# Patient Record
Sex: Female | Born: 2005 | Race: White | Hispanic: No | Marital: Single | Smoking: Former smoker
Health system: Southern US, Community
[De-identification: ages and names within clinical notes are randomized; demographics above are authoritative.]

## PROBLEM LIST (undated history)

## (undated) DIAGNOSIS — F4310 Post-traumatic stress disorder, unspecified: Secondary | ICD-10-CM

## (undated) DIAGNOSIS — F32A Depression, unspecified: Secondary | ICD-10-CM

## (undated) DIAGNOSIS — F603 Borderline personality disorder: Secondary | ICD-10-CM

## (undated) DIAGNOSIS — R634 Abnormal weight loss: Secondary | ICD-10-CM

## (undated) DIAGNOSIS — Z789 Other specified health status: Secondary | ICD-10-CM

## (undated) HISTORY — PX: NO PAST SURGERIES: SHX2092

## (undated) HISTORY — DX: Abnormal weight loss: R63.4

## (undated) HISTORY — DX: Depression, unspecified: F32.A

---
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
## 2023-03-05 ENCOUNTER — Other Ambulatory Visit: Payer: Self-pay

## 2023-03-05 ENCOUNTER — Encounter (HOSPITAL_COMMUNITY): Payer: Self-pay

## 2023-03-05 ENCOUNTER — Emergency Department (HOSPITAL_COMMUNITY): Payer: Medicaid Other

## 2023-03-05 ENCOUNTER — Emergency Department (HOSPITAL_COMMUNITY)
Admission: EM | Admit: 2023-03-05 | Discharge: 2023-03-05 | Disposition: A | Discharge status: Home / Self Care | Payer: Medicaid Other | Attending: Emergency Medicine | Admitting: Emergency Medicine

## 2023-03-05 DIAGNOSIS — S80212A Abrasion, left knee, initial encounter: Secondary | ICD-10-CM | POA: Insufficient documentation

## 2023-03-05 DIAGNOSIS — W5522XA Struck by cow, initial encounter: Secondary | ICD-10-CM | POA: Insufficient documentation

## 2023-03-05 DIAGNOSIS — Z79899 Other long term (current) drug therapy: Secondary | ICD-10-CM | POA: Insufficient documentation

## 2023-03-05 DIAGNOSIS — S50312A Abrasion of left elbow, initial encounter: Secondary | ICD-10-CM | POA: Diagnosis not present

## 2023-03-05 DIAGNOSIS — S80211A Abrasion, right knee, initial encounter: Secondary | ICD-10-CM | POA: Diagnosis not present

## 2023-03-05 DIAGNOSIS — S8011XA Contusion of right lower leg, initial encounter: Secondary | ICD-10-CM | POA: Diagnosis not present

## 2023-03-05 DIAGNOSIS — S8991XA Unspecified injury of right lower leg, initial encounter: Secondary | ICD-10-CM | POA: Diagnosis present

## 2023-03-05 MED ORDER — ACETAMINOPHEN 500 MG PO TABS: 500.0000 mg | ORAL_TABLET | Freq: Four times a day (QID) | ORAL | 0 refills | Status: DC | PRN

## 2023-03-05 NOTE — ED Provider Notes (Signed)
[place] EMERGENCY DEPARTMENT AT [facility] Provider Note   CSN: [id number] Arrival date & time: 03/05/23  1036     History  No chief complaint on file.   Wendy Short is a 17 y.o. female.  The history is provided by the patient and medical records. No language interpreter was used.     17 year old female with significant history of PTSD, borderline personality disorder, depression, presenting for evaluation of leg injury.  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  This morning while working, her cow was startled, and accidentally pushed her to the ground.  She fell forward striking both knees against the ground as well as her left elbow.  The cow accidentally stepped on her right calf and she quickly pulled her legs away.  She reported she felt pain to her right knee.  Pain is described as a sharp achy sensation worse with movement and with ambulation.  She also endorsed tingling sensation to her right foot.  She denies hitting her head or loss of consciousness.  She has not had a tetanus as she is unvaccinated by choice.  She denies any specific treatment tried.  She did receive an ice pack and reports some improvement of her symptoms.  She is here at the urging of her mother who is at bedside.  Home Medications Prior to Admission medications   Medication Sig Start Date End Date Taking? Authorizing Provider  hydrocortisone (ANUSOL-HC) 25 MG suppository Place 1 suppository (25 mg total) rectally 2 (two) times daily. 06/11/21   Warner Mccreedy, MD  Prenatal Vit-Fe Fumarate-FA (PRENATAL MULTIVITAMIN) TABS tablet Take 1 tablet by mouth daily at 12 noon.    [provider]  sertraline (ZOLOFT) 100 MG tablet Take 100 mg by mouth daily. 05/13/21   [provider]  spironolactone (ALDACTONE) 100 MG tablet Take 100 mg by mouth daily. 05/12/21   [provider]      Allergies    Patient has no known allergies.    Review of Systems   Review of Systems  All other  systems reviewed and are negative.   Physical Exam Updated Vital Signs BP 103/62 (BP Location: Right Arm)   Pulse 75   Temp 98.4 F (36.9 C)   Resp 17   Ht 5\' 4"  (1.626 m)   Wt 45.4 kg   SpO2 100%   BMI 17.16 kg/m  Physical Exam Vitals and nursing note reviewed.  Constitutional:      General: She is not in acute distress.    Appearance: She is well-developed.  HENT:     Head: Atraumatic.  Eyes:     Conjunctiva/sclera: Conjunctivae normal.  Cardiovascular:     Rate and Rhythm: Normal rate and regular rhythm.     Pulses: Normal pulses.     Heart sounds: Normal heart sounds.  Pulmonary:     Effort: Pulmonary effort is normal.  Abdominal:     Palpations: Abdomen is soft.     Tenderness: There is no abdominal tenderness.  Musculoskeletal:        General: Tenderness (Abrasions noted to bilateral knees. R knee: Tenderness noted to right anterior knee.  Patella is located.  Pain with flexion and extension and however no joint laxity and no decreased range motion.) present.     Cervical back: Neck supple.     Comments: Abrasion noted to left elbow with normal elbow flexion extension and no deformity noted.  Skin:    Findings: No rash.  Neurological:  Mental Status: She is alert and oriented to person, place, and time.  Psychiatric:        Mood and Affect: Mood normal.     ED Results / Procedures / Treatments   Labs (all labs ordered are listed, but only abnormal results are displayed) Labs Reviewed - No data to display  EKG None  Radiology DG Knee Complete 4 Views Right  Result Date: 03/05/2023 CLINICAL DATA:  Leg pain.  No reported history of trauma EXAM: RIGHT KNEE - COMPLETE 5 VIEW; RIGHT TIBIA AND FIBULA - 2 VIEW COMPARISON:  None Available. FINDINGS: No evidence of fracture, dislocation, or joint effusion. No evidence of arthropathy or other focal bone abnormality. Soft tissues are unremarkable. IMPRESSION: No acute osseous abnormality. Electronically Signed    By: Karen Kays M.D.   On: 03/05/2023 11:57   DG Tibia/Fibula Right  Result Date: 03/05/2023 CLINICAL DATA:  Leg pain.  No reported history of trauma EXAM: RIGHT KNEE - COMPLETE 5 VIEW; RIGHT TIBIA AND FIBULA - 2 VIEW COMPARISON:  None Available. FINDINGS: No evidence of fracture, dislocation, or joint effusion. No evidence of arthropathy or other focal bone abnormality. Soft tissues are unremarkable. IMPRESSION: No acute osseous abnormality. Electronically Signed   By: Karen Kays M.D.   On: 03/05/2023 11:57    Procedures Procedures    Medications Ordered in ED   ED Course/ Medical Decision Making/ A&P                             Medical Decision Making Amount and/or Complexity of Data Reviewed Radiology: ordered.   BP 103/62 (BP Location: Right Arm)   Pulse 75   Temp 98.4 F (36.9 C)   Resp 17   Ht 5\' 4"  (1.626 m)   Wt 45.4 kg   SpO2 100%   BMI 17.16 kg/m  4:36 PM  4:36 PM Wendy Short is a 17 year old female who presents to the ED with chief complaint of leg pain x 1 day. 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 The animal pushed her forward to the ground and had partial weight on her right calf, but she was able to push the cow back and stand up to get away. The patient has not attempted to walk since the incident this morning. She has been using ice intermittently, but has not tried any OTC pain medications. The pain at the time of the incident was 6/10, but now has decreased to 3/10. Most of her pain is relegated to her right patella with some radiation down her tibia. She also endorses numbness in her toes on the right side.   On exam, patient is resting comfortably in the bed appears to be in no acute discomfort.  She has a small abrasion noted to her left elbow as well as abrasion to bilateral knees.  She does have tenderness to the right anterior knee on exam but no obvious deformity.  She is able to ambulate.  She did complain of some mild  tingling sensation to her right foot and toes.  However on exam she has normal sensation and she is able to perform full range of motion about the right foot.  No evidence concerning for compartment syndrome, no significant deep injury noted.   -Imaging independently viewed and interpreted by me and I agree with radiologist's interpretation.  Result remarkable for Xrays of the R knee and  R tib/fib without concerning changes -This patient presents to the ED for concern of leg injury, this involves an extensive number of treatment options, and is a complaint that carries with it a high risk of complications and morbidity.  The differential diagnosis includes strain, sprain, fx, dislocation, compartment syndrome, abrasions -Co morbidities that complicate the patient evaluation includes none -Treatment includes ace wrap -Reevaluation of the patient after these medicines showed that the patient improved -PCP office notes or outside notes reviewed -Escalation to admission/observation considered: patients feels much better, is comfortable with discharge, and will follow up with PCP -Prescription medication considered, patient comfortable with ibuprofen, flexeril -Social Determinant of Health considered  I offered patient crutches, and pain medication but she declined.  Also offered her to Tdap patient declined that as well.  I will provide patient work note per request.  Gave patient return precaution.          Final Clinical Impression(s) / ED Diagnoses Final diagnoses:  Contusion of right calf, initial encounter  Abrasion of both knees  Abrasion of left elbow, initial encounter    Rx / DC Orders ED Discharge Orders          Ordered    acetaminophen (TYLENOL) 500 MG tablet  Every 6 hours PRN        03/05/23 1640              Fayrene Helper, PA-C 03/05/23 1641    Pricilla Loveless, MD 03/10/23 [id number]

## 2023-03-05 NOTE — ED Notes (Signed)
Ace wrap applied. PMS intact prior to and post wrap

## 2023-03-05 NOTE — ED Triage Notes (Signed)
Pt trampled by cow this am; c/o R knee and lower leg pain; unable to bare weigh and unable to bed leg; states leg bent inward; pedal pulse and sensation intact, limited movement; no obvious deformity

## 2023-03-05 NOTE — Discharge Instructions (Signed)
You have been evaluated for your injury.  Fortunately x-ray of your right knee and your right lower leg did not show any broken bone or dislocation.  Your pain is likely due to muscle bruising or possible ligament sprain.  Please use Ace wrap for support.  Take Tylenol as needed for pain, you may follow-up with orthopedic doctor as needed for further care.

## 2023-03-05 NOTE — ED Notes (Signed)
Pt is a&ox4, pwd. Pt states that she was trampled by a cow, sustaining an injury to right knee. She has pain 1/10, slight swelling and bruising to knee cap. PMS intact. Pt denies any other pain or discomfort. No other injuries noted or reported. Mother is at the bedside. Pt near RN station if she needs anything.

## 2024-04-13 ENCOUNTER — Telehealth: Admitting: *Deleted

## 2024-04-13 VITALS — Ht 65.0 in

## 2024-04-13 DIAGNOSIS — O0991 Supervision of high risk pregnancy, unspecified, first trimester: Secondary | ICD-10-CM

## 2024-04-13 DIAGNOSIS — O0990 Supervision of high risk pregnancy, unspecified, unspecified trimester: Secondary | ICD-10-CM

## 2024-04-13 DIAGNOSIS — Z3A10 10 weeks gestation of pregnancy: Secondary | ICD-10-CM

## 2024-04-13 DIAGNOSIS — Z3490 Encounter for supervision of normal pregnancy, unspecified, unspecified trimester: Secondary | ICD-10-CM | POA: Insufficient documentation

## 2024-04-13 NOTE — Progress Notes (Addendum)
 New OB Intake  I connected with Wendy Short  on 04/13/24 at  2:15 PM EDT by MyChart Video Visit and verified that I am speaking with the correct person using two identifiers. Nurse is located at [facility] and pt is located at home.  I discussed the limitations, risks, security and privacy concerns of performing an evaluation and management service by telephone and the availability of in person appointments. I also discussed with the patient that there may be a patient responsible charge related to this service. The patient expressed understanding and agreed to proceed.  I explained I am completing New OB Intake today. We discussed EDD of 11/03/04 based on LMP of 01/28/24. I offered dating US  because she explained her period was  a little different but she declined to have dating US . Pt is G3P011.  I reviewed her allergies, medications and Medical/Surgical/OB history.    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 Discussed the nature of our practice with multiple providers including residents and students. Due to the size of the practice, the delivering provider may not be the same as those providing prenatal care.   Patient is interested in water birth.  MyChart/Babyscripts MyChart access verified. I explained pt will have some visits in office and some virtually. Babyscripts instructions given and order placed.   Blood Pressure Cuff/Weight Scale She has a blood pressure cuff.  Explained after first prenatal appt pt will check weekly and document in Babyscripts. Patient does have weight scale.  Anatomy US  Explained first scheduled US  will be around 19 weeks.  Anatomy US  will be scheduled and patient notified by MyChart.   Is patient a CenteringPregnancy candidate?  Declined Declined due to Childcare   Is patient a Mom+Baby Combined Care candidate?  Not a candidate    Is patient a candidate for Babyscripts Optimization? Yes, patient accepted    First visit review I reviewed new OB appt with patient. Explained pt will be seen by Cornell Finder, CNM at first visit. Discussed [place] genetic screening with patient. She would like  Panorama drawn with routine prenatal labs at new ob visit.    Last Pap No results found for: EDMON Rock Skip OBIE 04/13/2024  3:06 PM

## 2024-04-18 ENCOUNTER — Encounter: Payer: Self-pay | Admitting: Certified Nurse Midwife

## 2024-04-18 ENCOUNTER — Other Ambulatory Visit: Payer: Self-pay | Admitting: *Deleted

## 2024-04-18 DIAGNOSIS — O0990 Supervision of high risk pregnancy, unspecified, unspecified trimester: Secondary | ICD-10-CM

## 2024-04-18 MED ORDER — ONDANSETRON 4 MG PO TBDP: 4.0000 mg | ORAL_TABLET | Freq: Three times a day (TID) | ORAL | 0 refills | Status: DC | PRN

## 2024-04-29 ENCOUNTER — Other Ambulatory Visit: Payer: Self-pay | Admitting: Lactation Services

## 2024-04-29 MED ORDER — ONDANSETRON HCL 4 MG PO TABS: 4.0000 mg | ORAL_TABLET | Freq: Three times a day (TID) | ORAL | 1 refills | Status: DC | PRN

## 2024-05-04 ENCOUNTER — Ambulatory Visit (INDEPENDENT_AMBULATORY_CARE_PROVIDER_SITE_OTHER): Payer: Self-pay | Admitting: Certified Nurse Midwife

## 2024-05-04 ENCOUNTER — Other Ambulatory Visit (HOSPITAL_COMMUNITY)
Admission: RE | Admit: 2024-05-04 | Discharge: 2024-05-04 | Disposition: A | Discharge status: Home / Self Care | Source: Ambulatory Visit | Attending: Certified Nurse Midwife | Admitting: Certified Nurse Midwife

## 2024-05-04 ENCOUNTER — Other Ambulatory Visit: Payer: Self-pay

## 2024-05-04 VITALS — BP 111/72 | HR 80 | Wt 112.3 lb

## 2024-05-04 DIAGNOSIS — Z3490 Encounter for supervision of normal pregnancy, unspecified, unspecified trimester: Secondary | ICD-10-CM | POA: Diagnosis present

## 2024-05-04 DIAGNOSIS — O99611 Diseases of the digestive system complicating pregnancy, first trimester: Secondary | ICD-10-CM | POA: Diagnosis not present

## 2024-05-04 DIAGNOSIS — O0931 Supervision of pregnancy with insufficient antenatal care, first trimester: Secondary | ICD-10-CM | POA: Diagnosis not present

## 2024-05-04 DIAGNOSIS — Z1332 Encounter for screening for maternal depression: Secondary | ICD-10-CM

## 2024-05-04 DIAGNOSIS — Z3A13 13 weeks gestation of pregnancy: Secondary | ICD-10-CM | POA: Diagnosis not present

## 2024-05-04 DIAGNOSIS — O0932 Supervision of pregnancy with insufficient antenatal care, second trimester: Secondary | ICD-10-CM

## 2024-05-04 DIAGNOSIS — K5900 Constipation, unspecified: Secondary | ICD-10-CM

## 2024-05-04 DIAGNOSIS — O219 Vomiting of pregnancy, unspecified: Secondary | ICD-10-CM

## 2024-05-04 MED ORDER — POLYETHYLENE GLYCOL 3350 17 G PO PACK
17.0000 g | PACK | Freq: Every day | ORAL | 1 refills | Status: AC
Start: 2024-05-04 — End: ?

## 2024-05-04 MED ORDER — ONDANSETRON 4 MG PO TBDP: 4.0000 mg | ORAL_TABLET | Freq: Three times a day (TID) | ORAL | 0 refills | Status: DC | PRN

## 2024-05-05 LAB — CBC/D/PLT+RPR+RH+ABO+RUBIGG...
Antibody Screen: NEGATIVE
Basophils Absolute: 0 x10E3/uL (ref 0.0–0.2)
Basos: 0 %
EOS (ABSOLUTE): 0.1 x10E3/uL (ref 0.0–0.4)
Eos: 1 %
HCV Ab: NONREACTIVE
HIV Screen 4th Generation wRfx: NONREACTIVE
Hematocrit: 37.3 % (ref 34.0–46.6)
Hemoglobin: 12.5 g/dL (ref 11.1–15.9)
Hepatitis B Surface Ag: NEGATIVE
Immature Grans (Abs): 0 x10E3/uL (ref 0.0–0.1)
Immature Granulocytes: 0 %
Lymphocytes Absolute: 2.1 x10E3/uL (ref 0.7–3.1)
Lymphs: 23 %
MCH: 31.8 pg (ref 26.6–33.0)
MCHC: 33.5 g/dL (ref 31.5–35.7)
MCV: 95 fL (ref 79–97)
Monocytes Absolute: 0.6 x10E3/uL (ref 0.1–0.9)
Monocytes: 6 %
Neutrophils Absolute: 6.3 x10E3/uL (ref 1.4–7.0)
Neutrophils: 70 %
Platelets: 223 x10E3/uL (ref 150–450)
RBC: 3.93 x10E6/uL (ref 3.77–5.28)
RDW: 12.1 % (ref 11.7–15.4)
RPR Ser Ql: NONREACTIVE
Rh Factor: POSITIVE
Rubella Antibodies, IGG: <0.9 {index} — ABNORMAL LOW (ref >0.99)
WBC: 9.2 x10E3/uL (ref 3.4–10.8)

## 2024-05-05 LAB — CERVICOVAGINAL ANCILLARY ONLY
Chlamydia: NEGATIVE
Comment: NEGATIVE
Comment: NORMAL
Neisseria Gonorrhea: NEGATIVE

## 2024-05-05 LAB — ANEMIA PROFILE B
Ferritin: 24 ng/mL (ref 15–77)
Folate: >20 ng/mL (ref >3.0)
Iron Saturation: 22 % (ref 15–55)
Iron: 97 ug/dL (ref 27–159)
Retic Ct Pct: 1.9 % (ref 0.6–2.6)
Total Iron Binding Capacity: 433 ug/dL (ref 250–450)
UIBC: 336 ug/dL (ref 131–425)
Vitamin B-12: 312 pg/mL (ref 232–1245)

## 2024-05-05 LAB — HEMOGLOBIN A1C
Est. average glucose Bld gHb Est-mCnc: 94 mg/dL
Hgb A1c MFr Bld: 4.9 % (ref 4.8–5.6)

## 2024-05-05 LAB — HCV INTERPRETATION

## 2024-05-05 LAB — VITAMIN D 25 HYDROXY (VIT D DEFICIENCY, FRACTURES): Vit D, 25-Hydroxy: 33 ng/mL (ref 30.0–100.0)

## 2024-05-06 LAB — CULTURE, OB URINE

## 2024-05-06 LAB — URINE CULTURE, OB REFLEX

## 2024-05-09 LAB — PANORAMA PRENATAL TEST FULL PANEL:PANORAMA TEST PLUS 5 ADDITIONAL MICRODELETIONS: FETAL FRACTION: 7.4

## 2024-05-10 NOTE — Progress Notes (Signed)
 History:   Wendy Short is a 18 y.o. G3P1011 at [redacted]w[redacted]d by LMP being seen today for her first obstetrical visit.  Her obstetrical history is significant for one uncomplicated pregnancy with SVD. Patient does intend to breast feed. Pregnancy history fully reviewed.  Patient reports continued nausea that responds well to zofran  but not diclegis.   HISTORY: OB History  Gravida Para Term Preterm AB Living  3 1 1  0 1 1  SAB IAB Ectopic Multiple Live Births  0 0 0 0 1    # Outcome Date GA Lbr Len/2nd Weight Sex Type Anes PTL Lv  3 Current           2 AB 2023          1 Term 12/28/20 [redacted]w[redacted]d 07:17 / 00:55 6 lb 10.2 oz (3.01 kg) F Vag-Spont None  LIV     Birth Comments: WNL poor fetal growth  on US      Name: CAROLIN, QUANG     Apgar1: 8  Apgar5: 9    Pap not indicated (age)  Past Medical History:  Diagnosis Date   Borderline personality disorder (HCC)    Depression    Poor fetal growth affecting management of mother in third trimester 11/19/2020   Pregnancy affected by fetal growth restriction 12/24/2020   PTSD (post-traumatic stress disorder)    Supervision of high risk pregnancy, antepartum 05/31/2020    Nursing Staff Provider Office Location [facility] Dating  Early US  at [redacted]w[redacted]d Language  English Anatomy US    Flu Vaccine  Declined Genetic Screen  NIPS: low risk female AFP:  Screen Negative  TDaP Vaccine  Declined 11/19/20  Hgb A1C or  GTT Early  A1C 5.3 Third trimester  COVID Vaccine no   LAB RESULTS  Rhogam  NA O+ Blood Type O/Positive/-- (09/21 1608)  Feeding Plan Breast Antibody Negative (09/21 1608) C   Supervision of low-risk first pregnancy 12/28/2020   Unplanned pregnancy 06/19/2020   Weight loss    Past Surgical History:  Procedure Laterality Date   NO PAST SURGERIES     Family History  Problem Relation Age of Onset   Skin cancer Mother    Allergies Father    Asthma Sister    Colitis Paternal Grandfather    Social History   Tobacco Use   Smoking status: Former     Current packs/day: 0.00    Average packs/day: 0.3 packs/day for 0.5 years (0.1 ttl pk-yrs)    Types: E-cigarettes, Cigarettes    Start date: 02/29/2020    Quit date: 08/29/2020    Years since quitting: 3.6   Smokeless tobacco: Never  Vaping Use   Vaping status: Former  Substance Use Topics   Alcohol use: Not Currently   Drug use: Not Currently    Types: Marijuana   No Known Allergies Current Outpatient Medications on File Prior to Visit  Medication Sig Dispense Refill   Prenatal MV & Min w/FA-DHA (PRENATAL GUMMIES PO) Take 2 tablets by mouth daily at 6 (six) AM.     No current facility-administered medications on file prior to visit.   Review of Systems Pertinent items noted in HPI and remainder of comprehensive ROS otherwise negative.  Physical Exam:   Vitals:   05/04/24 1032  BP: 111/72  Pulse: 80  Weight: 112 lb 4.8 oz (50.9 kg)   Fetal Heart Rate (bpm): 160  Constitutional: Well-developed, well-nourished pregnant female in no acute distress.  HEENT: PERRLA Skin: normal color and turgor, no rash Cardiovascular:  normal rate & rhythm, warm and well perfused Respiratory: normal effort, no problems with respiration noted GI: Abd soft, non-distended MS: Extremities nontender, no edema, normal ROM Neurologic: Alert and oriented x 4.  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 Encounter for supervision of low-risk pregnancy, antepartum (Primary) - Doing well, has been able to gain a little weight despite the nausea - CBC/D/Plt+RPR+Rh+ABO+RubIgG... - HgB A1c - Vitamin D  (25 hydroxy) - Anemia Profile B - Culture, OB Urine -  PANORAMA PRENATAL TEST - Cervicovaginal ancillary only  2. [redacted] weeks gestation of pregnancy - Routine OB care, will offer AFP at next visit  3. Nausea and vomiting during pregnancy - ondansetron  (ZOFRAN -ODT) 4 MG disintegrating tablet; Take 1 tablet (4 mg total) by mouth every 8 (eight) hours as needed for nausea or vomiting.  Dispense: 20 tablet; Refill: 0  4. Constipation during pregnancy in first trimester - polyethylene glycol (MIRALAX ) 17 g packet; Take 17 g by mouth daily.  Dispense: 30 each; Refill: 1  5. Initial obstetric visit in second trimester - Initial labs drawn. - Continue prenatal vitamins. - Problem list reviewed and updated. - Genetic Screening discussed, First trimester screen, Quad screen, and NIPS: ordered. - Ultrasound discussed; fetal anatomic survey: ordered. - Anticipatory guidance about prenatal visits given including labs, ultrasounds, and testing. - Discussed usage of Babyscripts and virtual visits as additional source of managing and completing prenatal visits in midst of coronavirus and pandemic.   - Encouraged to complete MyChart Registration for her ability to review results, send requests, and have questions addressed.  - The nature of [facility] Healthcare/Faculty Practice with multiple MDs and Advanced Practice Providers was explained to patient; also emphasized that residents, students are part of our team. - Routine obstetric precautions reviewed. Encouraged to seek out care at office or emergency room [facility] preferred) for urgent and/or emergent concerns.  Return in about 4 weeks (around 06/01/2024) for IN-PERSON, LOB.    Future Appointments  Date Time Provider Department Center  06/08/2024 11:15 AM Vannie Cornell JONELLE EDDY [facility]  06/14/2024  8:00 AM [facility] PROVIDER 1 [facility]  06/14/2024  8:30 AM [facility] US3 [facility]    Cornell Vannie, MSN, CNM, IBCLC Certified Nurse Midwife, [facility]

## 2024-05-12 MED ORDER — ONDANSETRON 4 MG PO TBDP: 4.0000 mg | ORAL_TABLET | Freq: Three times a day (TID) | ORAL | 0 refills | Status: DC | PRN

## 2024-06-08 ENCOUNTER — Other Ambulatory Visit: Payer: Self-pay

## 2024-06-08 ENCOUNTER — Ambulatory Visit: Payer: Self-pay | Admitting: Certified Nurse Midwife

## 2024-06-08 VITALS — BP 102/58 | HR 92 | Wt 116.3 lb

## 2024-06-08 DIAGNOSIS — Z3A18 18 weeks gestation of pregnancy: Secondary | ICD-10-CM | POA: Diagnosis not present

## 2024-06-08 DIAGNOSIS — Z3492 Encounter for supervision of normal pregnancy, unspecified, second trimester: Secondary | ICD-10-CM | POA: Diagnosis not present

## 2024-06-08 NOTE — Progress Notes (Signed)
   PRENATAL VISIT NOTE  Subjective:  Wendy Short is a 18 y.o. G3P1011 at [redacted]w[redacted]d being seen today for ongoing prenatal care.  She is currently monitored for the following issues for this low-risk pregnancy and has MDD (major depressive disorder), recurrent severe, without psychosis (HCC); Chronic post-traumatic stress disorder (PTSD); Rubella non-immune status, antepartum; and Supervision of low-risk pregnancy on their problem list.  Patient reports no complaints.  Contractions: Not present. Vag. Bleeding: None.  Movement: Present. Denies leaking of fluid.   The following portions of the patient's history were reviewed and updated as appropriate: allergies, current medications, past family history, past medical history, past social history, past surgical history and problem list.   Objective:    Vitals:   06/08/24 1134  BP: (!) 102/58  Pulse: 92  Weight: 116 lb 4.8 oz (52.8 kg)    Fetal Status:  Fetal Heart Rate (bpm): 155   Movement: Present    General: Alert, oriented and cooperative. Patient is in no acute distress.  Skin: Skin is warm and dry. No rash noted.   Cardiovascular: Normal heart rate noted  Respiratory: Normal respiratory effort, no problems with respiration noted  Abdomen: Soft, gravid, appropriate for gestational age.  Pain/Pressure: Absent     Pelvic: Cervical exam deferred        Extremities: Normal range of motion.  Edema: None  Mental Status: Normal mood and affect. Normal behavior. Normal judgment and thought content.   Assessment and Plan:  Pregnancy: G3P1011 at [redacted]w[redacted]d 1. Encounter for supervision of low-risk pregnancy in second trimester (Primary) - Starting to feel some fetal movement  2. [redacted] weeks gestation of pregnancy - Routine OB care  - AFP, Serum, Open Spina Bifida  Preterm labor symptoms and general obstetric precautions including but not limited to vaginal bleeding, contractions, leaking of fluid and fetal movement were reviewed in detail with  the patient. Please refer to After Visit Summary for other counseling recommendations.   Return in about 4 weeks (around 07/06/2024) for IN-PERSON, LOB.  Future Appointments  Date Time Provider Department Center  06/14/2024  8:00 AM [facility] PROVIDER 1 [facility]  06/14/2024  8:30 AM [facility] US3 [facility]  07/06/2024  3:15 PM Vannie Cornell JONELLE EDDY [facility]  08/10/2024 10:55 AM Vannie Cornell JONELLE, CNM [facility]   Cornell JONELLE Vannie, CNM

## 2024-06-10 LAB — AFP, SERUM, OPEN SPINA BIFIDA
AFP MoM: 0.89
AFP Value: 49.6 ng/mL
Gest. Age on Collection Date: 18.6 wk
Maternal Age At EDD: 20.1 a
OSBR Risk 1 IN: 10000
Test Results:: NEGATIVE
Weight: 116 [lb_av]

## 2024-06-13 ENCOUNTER — Other Ambulatory Visit: Payer: Self-pay | Admitting: *Deleted

## 2024-06-13 ENCOUNTER — Encounter: Payer: Self-pay | Admitting: Certified Nurse Midwife

## 2024-06-13 MED ORDER — ONDANSETRON 4 MG PO TBDP: 4.0000 mg | ORAL_TABLET | Freq: Three times a day (TID) | ORAL | 0 refills | Status: DC | PRN

## 2024-06-14 ENCOUNTER — Ambulatory Visit: Attending: Maternal & Fetal Medicine | Admitting: Maternal & Fetal Medicine

## 2024-06-14 ENCOUNTER — Other Ambulatory Visit: Payer: Self-pay | Admitting: Certified Nurse Midwife

## 2024-06-14 ENCOUNTER — Ambulatory Visit (HOSPITAL_BASED_OUTPATIENT_CLINIC_OR_DEPARTMENT_OTHER)

## 2024-06-14 VITALS — BP 116/51

## 2024-06-14 DIAGNOSIS — Z363 Encounter for antenatal screening for malformations: Secondary | ICD-10-CM | POA: Diagnosis present

## 2024-06-14 DIAGNOSIS — O3503X0 Maternal care for (suspected) central nervous system malformation or damage in fetus, choroid plexus cysts, not applicable or unspecified: Secondary | ICD-10-CM

## 2024-06-14 DIAGNOSIS — Z3490 Encounter for supervision of normal pregnancy, unspecified, unspecified trimester: Secondary | ICD-10-CM

## 2024-06-14 DIAGNOSIS — Z3A19 19 weeks gestation of pregnancy: Secondary | ICD-10-CM | POA: Diagnosis not present

## 2024-06-14 DIAGNOSIS — O358XX0 Maternal care for other (suspected) fetal abnormality and damage, not applicable or unspecified: Secondary | ICD-10-CM | POA: Diagnosis not present

## 2024-06-14 DIAGNOSIS — O0990 Supervision of high risk pregnancy, unspecified, unspecified trimester: Secondary | ICD-10-CM

## 2024-06-14 DIAGNOSIS — O3502X0 Maternal care for (suspected) central nervous system malformation or damage in fetus, anencephaly, not applicable or unspecified: Secondary | ICD-10-CM | POA: Diagnosis not present

## 2024-06-14 NOTE — Progress Notes (Signed)
 Patient information  Patient Name: Wendy Short  Patient MRN:   [id number]  Referring practice: MFM Referring Provider: [facility])  Problem List   Patient Active Problem List   Diagnosis Date Noted   Supervision of low-risk pregnancy 04/13/2024   Rubella non-immune status, antepartum 06/21/2020   Chronic post-traumatic stress disorder (PTSD) 06/25/2018   MDD (major depressive disorder), recurrent severe, without psychosis (HCC) 06/24/2018   Maternal Fetal Medicine Consult Wendy Short is a 18 y.o. G3P1011 at [redacted]w[redacted]d here for ultrasound and consultation. She had Low risk aneuploidy screening of a female fetus. Carrier screening was Negative for the basic screening (SMA, alpha-thal, beta-thal, and cystic fibroisis. Maternal serum AFP was negative. She has no acute concerns.   Today we focused on the following:   Choroid plexus cyst (CPC) I discussed the finding of right sided choroid plexus cyst was noted in the lateral ventricle of the fetal brain. There are no other structural abnormalities and management low risk aneuploidy screening.  In this context, a CPC is considered a normal variant and no further workup is needed at this time.  The Society for maternal-fetal medicine does not recommend amniocentesis for isolated choroid plexus cysts in the setting of low risk aneuploidy screening.  I reassured the patient that this should be interpreted as a normal variant and does not have any adverse impact on her fetus.  CPCs are fluid-filled structures in the choroid plexus. CPCs may be single or multiple, unilateral or bilateral, and most often are <1 cm in diameter. CPCs are identified in approximately 1% to 2% of fetuses in the second trimester of pregnancy and are commonly isolated findings in fetuses with euploid. CPCs are associated with trisomy 23, as they are present in 30% to 50% of fetuses with this disorder.62 When a fetus is affected by trisomy 18,  multiple structural anomalies are almost always evident, including structural heart defects, clenched hands, talipes deformity of the feet, FGR, and polyhydramnios. When a structural anomaly is present in addition to CPCs, the positive LR is 66. In the absence of other ultrasonographic abnormalities, the likelihood of trisomy 65 with isolated CPCs is much lower. Early studies suggested that isolated CPCs conferred a 1 in 200 to 1 in 400 risk of trisomy 1. More recent studies among women with isolated CPCs suggest a range of positive LRs for trisomy 18, from 0.9 to 5.6, with most studies suggesting low risk. Based on the literature, the best estimate for the LR is <2, suggesting a minimal risk. The presence of a CPC does not alter the risk of trisomy 27. Ultrasound characteristics of CPCs (size, complexity, laterality, and persistence) should not be used to modify risk further because these factors do not impact the likelihood of trisomy 18.  A CPC is not considered a structural or functional brain abnormality, and nearly all CPCs resolve by 28 weeks. Neurodevelopmental outcomes in children with euploidy born after a prenatal diagnosis of CPCs have not shown differences in neurocognitive ability, motor function, or behavior.  Sonographic findings Single intrauterine pregnancy at 19w 5d. Fetal cardiac activity:  Observed and appears normal. Presentation: Cephalic. The anatomic structures that were well seen appear normal without evidence of soft markers. The anatomic survey is complete.  Fetal biometry shows the estimated fetal weight at the 77 percentile. Amniotic fluid: Within normal limits.  MVP: 3.07 cm. Placenta: Posterior. Adnexa: No abnormality visualized. Cervical length: 4.1 cm.  There are limitations of prenatal ultrasound such  as the inability to detect certain abnormalities due to poor visualization. Various factors such as fetal position, gestational age and maternal body habitus may  increase the difficulty in visualizing the fetal anatomy.    Recommendations -EDD should be 11/03/2024 based on  LMP  (01/28/24). -No further ultrasounds are recommended at this time based on the current indications. If future indications arise (e.g. size/date discrepancy on fundal height, gestational diabetes or hypertension) and an ultrasound is to be desired at our MFM office, please send a referral.   Review of Systems: A review of systems was performed and was negative except per HPI   Past Obstetrical History:  OB History  Gravida Para Term Preterm AB Living  3 1 1  1 1   SAB IAB Ectopic Multiple Live Births     0 1    # Outcome Date GA Lbr Len/2nd Weight Sex Type Anes PTL Lv  3 Current           2 AB 2023          1 Term 12/28/20 [redacted]w[redacted]d 07:17 / 00:55 6 lb 10.2 oz (3.01 kg) F Vag-Spont None  LIV     Birth Comments: WNL poor fetal growth  on US      Past Medical History:  Past Medical History:  Diagnosis Date   Borderline personality disorder (HCC)    Depression    Poor fetal growth affecting management of mother in third trimester 11/19/2020   Pregnancy affected by fetal growth restriction 12/24/2020   PTSD (post-traumatic stress disorder)    Supervision of high risk pregnancy, antepartum 05/31/2020    Nursing Staff Provider Office Location [facility] Dating  Early US  at [redacted]w[redacted]d Language  English Anatomy US    Flu Vaccine  Declined Genetic Screen  NIPS: low risk female AFP:  Screen Negative  TDaP Vaccine  Declined 11/19/20  Hgb A1C or  GTT Early  A1C 5.3 Third trimester  COVID Vaccine no   LAB RESULTS  Rhogam  NA O+ Blood Type O/Positive/-- (09/21 1608)  Feeding Plan Breast Antibody Negative (09/21 1608) C   Supervision of low-risk first pregnancy 12/28/2020   Unplanned pregnancy 06/19/2020   Weight loss      Past Surgical History:    Past Surgical History:  Procedure Laterality Date   NO PAST SURGERIES       Home Medications:   Current Outpatient Medications on File Prior to  Visit  Medication Sig Dispense Refill   ondansetron  (ZOFRAN -ODT) 4 MG disintegrating tablet Take 1 tablet (4 mg total) by mouth every 8 (eight) hours as needed for nausea or vomiting. 30 tablet 0   polyethylene glycol (MIRALAX ) 17 g packet Take 17 g by mouth daily. 30 each 1   Prenatal MV & Min w/FA-DHA (PRENATAL GUMMIES PO) Take 2 tablets by mouth daily at 6 (six) AM.     No current facility-administered medications on file prior to visit.    Allergies:   No Known Allergies   Physical Exam:   Vitals:   06/14/24 0812  BP: (!) 116/51   Sitting comfortably on the sonogram table Nonlabored breathing Normal rate and rhythm Abdomen is nontender  Thank you for the opportunity to be involved with this patient's care. Please let us  know if we can be of any further assistance.   30 minutes of time was spent reviewing the patient's chart including labs, imaging and documentation.  At least 50% of this time was spent with direct patient care discussing the diagnosis, management  and prognosis of her care.  Delora Smaller MFM, [facility]   06/14/2024  2:34 PM

## 2024-06-27 ENCOUNTER — Encounter: Payer: Self-pay | Admitting: Certified Nurse Midwife

## 2024-07-06 ENCOUNTER — Encounter: Payer: Self-pay | Admitting: Certified Nurse Midwife

## 2024-07-06 ENCOUNTER — Other Ambulatory Visit: Payer: Self-pay

## 2024-07-06 ENCOUNTER — Ambulatory Visit (INDEPENDENT_AMBULATORY_CARE_PROVIDER_SITE_OTHER): Admitting: Certified Nurse Midwife

## 2024-07-06 VITALS — BP 109/66 | HR 93 | Wt 120.1 lb

## 2024-07-06 DIAGNOSIS — H539 Unspecified visual disturbance: Secondary | ICD-10-CM

## 2024-07-06 DIAGNOSIS — Z3492 Encounter for supervision of normal pregnancy, unspecified, second trimester: Secondary | ICD-10-CM | POA: Diagnosis not present

## 2024-07-06 DIAGNOSIS — Z3A22 22 weeks gestation of pregnancy: Secondary | ICD-10-CM

## 2024-07-06 NOTE — Progress Notes (Unsigned)
   PRENATAL VISIT NOTE  Subjective:  Wendy Short is a 18 y.o. G3P1011 at [redacted]w[redacted]d being seen today for ongoing prenatal care.  She is currently monitored for the following issues for this low-risk pregnancy and has MDD (major depressive disorder), recurrent severe, without psychosis (HCC); Chronic post-traumatic stress disorder (PTSD); Rubella non-immune status, antepartum; Supervision of low-risk pregnancy; and Choroid plexus cyst of fetus affecting care of mother, antepartum on their problem list.  Patient reports doing well, but does note occasional spells where the sight in her right eye will go out and her pupil will dilate for about 30 minutes. No pain, dizziness or associated symptoms. Started happening in her last pregnancy and have continued - note they are more frequent during pregnancy (every 1-49mo vs q82mo non-pregnant).  Contractions: Irritability. Vag. Bleeding: None.  Movement: Present. Denies leaking of fluid.   The following portions of the patient's history were reviewed and updated as appropriate: allergies, current medications, past family history, past medical history, past social history, past surgical history and problem list.   Objective:   Vitals:   07/06/24 1553  BP: 109/66  Pulse: 93  Weight: 120 lb 1.6 oz (54.5 kg)   Fetal Status:  Fetal Heart Rate (bpm): 157   Movement: Present    General: Alert, oriented and cooperative. Patient is in no acute distress.  Skin: Skin is warm and dry. No rash noted.   Cardiovascular: Normal heart rate noted  Respiratory: Normal respiratory effort, no problems with respiration noted  Abdomen: Soft, gravid, appropriate for gestational age.  Pain/Pressure: Absent     Pelvic: Cervical exam deferred        Extremities: Normal range of motion.  Edema: None  Mental Status: Normal mood and affect. Normal behavior. Normal judgment and thought content.   Assessment and Plan:  Pregnancy: G3P1011 at [redacted]w[redacted]d 1. Encounter for supervision of  low-risk pregnancy in second trimester (Primary) - Doing well, feeling regular and vigorous fetal movement   2. [redacted] weeks gestation of pregnancy - Routine OB care including anticipatory guidance for GTT at next visit (will premedicate with zofran )  3. Visual disturbance of one eye - Referred to local optometrist who will refer to opthalmology if needed - Ambulatory referral to Neurology  Preterm labor symptoms and general obstetric precautions including but not limited to vaginal bleeding, contractions, leaking of fluid and fetal movement were reviewed in detail with the patient. Please refer to After Visit Summary for other counseling recommendations.   Return in about 4 weeks (around 08/03/2024) for IN-PERSON, LOB/GTT.  Future Appointments  Date Time Provider Department Center  08/10/2024  9:20 AM [facility]  08/10/2024 10:55 AM Vannie Cornell SAUNDERS, CNM [facility]   Cornell SAUNDERS Vannie, CNM

## 2024-07-12 ENCOUNTER — Ambulatory Visit (INDEPENDENT_AMBULATORY_CARE_PROVIDER_SITE_OTHER): Admitting: Neurology

## 2024-07-12 ENCOUNTER — Encounter: Payer: Self-pay | Admitting: Neurology

## 2024-07-12 VITALS — BP 100/60 | HR 89 | Ht 65.5 in | Wt 123.5 lb

## 2024-07-12 DIAGNOSIS — H539 Unspecified visual disturbance: Secondary | ICD-10-CM | POA: Diagnosis not present

## 2024-07-12 DIAGNOSIS — G43109 Migraine with aura, not intractable, without status migrainosus: Secondary | ICD-10-CM

## 2024-07-12 DIAGNOSIS — Z3490 Encounter for supervision of normal pregnancy, unspecified, unspecified trimester: Secondary | ICD-10-CM

## 2024-07-12 NOTE — Progress Notes (Signed)
 [facility]  PATIENT: Wendy Short DOB: [birth year]  REFERRING DOCTOR OR PCP: Cornell Finder, CNM SOURCE: Patient, notes from OB/GYN  _________________________________   HISTORICAL  CHIEF COMPLAINT:  Chief Complaint  Patient presents with   New Patient (Initial Visit)    Pt in room 10. Mother in room. Here for Urgent internal referral for visual disturbance of one eye, more frequent during pregnancy.    HISTORY OF PRESENT ILLNESS:  I had the pleasure to see your patient, Wendy Short, at [facility] for neurologic consultation regarding her intermittent visual disturbances that have become more frequent during her pregnancy.  She is a 18 year old woman who is currently 6 months pregnant who has noted visual changes since shortly after her first daughter was born in 2022.   These events are fairly similar.  She notes reduced visual field out of the right eye, in the right (temporal) field.  Sometimes she sees static (some white spots) in the hemifield.  She notes OS vision will be fine.  These episodes lasting 90 minutes.    She also has been noted to have pupil dilation on the right during these episodes.   Sometimes she has 60-90 minutes pupil dilation without VF changes.   In between episodes vision is fine.    The more severe episodes have occurred about 7-8 times, with 3 episodes since pregnant.    She does not note any precipitating event or trigger.   She will sometimes have a right sided headache but these do not follow the visual episodes.   These visual symptoms are not followed by headache.  She is currently 6 months pregnant and has not had any problems.     She has not had any recent imaging.   REVIEW OF SYSTEMS: Constitutional: No fevers, chills, sweats, or change in appetite Eyes: No visual changes, double vision, eye pain Ear, nose and throat: No hearing loss, ear pain, nasal congestion, sore throat Cardiovascular: No chest  pain, palpitations Respiratory:  No shortness of breath at rest or with exertion.   No wheezes GastrointestinaI: No nausea, vomiting, diarrhea, abdominal pain, fecal incontinence Genitourinary:  No dysuria, urinary retention or frequency.  No nocturia. Musculoskeletal:  No neck pain, back pain Integumentary: No rash, pruritus, skin lesions Neurological: as above Psychiatric: No depression at this time.  No anxiety she has a history of major depression. Endocrine: No palpitations, diaphoresis, change in appetite, change in weigh or increased thirst Hematologic/Lymphatic:  No anemia, purpura, petechiae. Allergic/Immunologic: No itchy/runny eyes, nasal congestion, recent allergic reactions, rashes  ALLERGIES: No Known Allergies  HOME MEDICATIONS:  Current Outpatient Medications:    ondansetron  (ZOFRAN -ODT) 4 MG disintegrating tablet, Take 1 tablet (4 mg total) by mouth every 8 (eight) hours as needed for nausea or vomiting., Disp: 30 tablet, Rfl: 0   polyethylene glycol (MIRALAX ) 17 g packet, Take 17 g by mouth daily., Disp: 30 each, Rfl: 1   Prenatal MV & Min w/FA-DHA (PRENATAL GUMMIES PO), Take 2 tablets by mouth daily at 6 (six) AM., Disp: , Rfl:   PAST MEDICAL HISTORY: Past Medical History:  Diagnosis Date   Borderline personality disorder (HCC)    Depression    Poor fetal growth affecting management of mother in third trimester 11/19/2020   Pregnancy affected by fetal growth restriction 12/24/2020   PTSD (post-traumatic stress disorder)    Supervision of high risk pregnancy, antepartum 05/31/2020    Nursing Staff Provider Office Location [facility] Dating  Early US  at  [redacted]w[redacted]d Language  English Anatomy US    Flu Vaccine  Declined Genetic Screen  NIPS: low risk female AFP:  Screen Negative  TDaP Vaccine  Declined 11/19/20  Hgb A1C or  GTT Early  A1C 5.3 Third trimester  COVID Vaccine no   LAB RESULTS  Rhogam  NA O+ Blood Type O/Positive/-- (09/21 1608)  Feeding Plan Breast Antibody Negative (09/21  1608) C   Supervision of low-risk first pregnancy 12/28/2020   Unplanned pregnancy 06/19/2020   Weight loss     PAST SURGICAL HISTORY: Past Surgical History:  Procedure Laterality Date   NO PAST SURGERIES      FAMILY HISTORY: Family History  Problem Relation Age of Onset   Cancer Mother    Skin cancer Mother    Allergies Father    Asthma Sister    Colitis Paternal Grandfather    Diabetes Neg Hx    Heart disease Neg Hx    Hypertension Neg Hx     SOCIAL HISTORY: Social History   Socioeconomic History   Marital status: Single    Spouse name: Not on file   Number of children: Not on file   Years of education: Not on file   Highest education level: Not on file  Occupational History   Occupation: Student  Tobacco Use   Smoking status: Former    Current packs/day: 0.00    Average packs/day: 0.3 packs/day for 0.5 years (0.1 ttl pk-yrs)    Types: E-cigarettes, Cigarettes    Start date: 02/29/2020    Quit date: 08/29/2020    Years since quitting: 3.8   Smokeless tobacco: Never  Vaping Use   Vaping status: Former  Substance and Sexual Activity   Alcohol use: Not Currently   Drug use: Not Currently    Types: Marijuana    Comment: last use years ago   Sexual activity: Yes    Birth control/protection: None  Other Topics Concern   Not on file  Social History Narrative   homeschooled at 1-2 grade level; currently at [employer] -- 9th grade         Social Drivers of Health   Financial Resource Strain: Not on file  Food Insecurity: No Food Insecurity (05/04/2024)   Hunger Vital Sign    Worried About Running Out of Food in the Last Year: Never true    Ran Out of Food in the Last Year: Never true  Transportation Needs: No Transportation Needs (05/04/2024)   PRAPARE - Administrator, Civil Service (Medical): No    Lack of Transportation (Non-Medical): No  Physical Activity: Not on file  Stress: Not on file  Social Connections: Not on file   Intimate Partner Violence: Not on file       PHYSICAL EXAM  Vitals:   07/12/24 1531  BP: 100/60  Pulse: 89  SpO2: 98%  Weight: 123 lb 8 oz (56 kg)  Height: 5' 5.5 (1.664 m)    Body mass index is 20.24 kg/m.  Vision Screening   Right eye Left eye Both eyes  Without correction 20/20 20/30 20/30   With correction      Color vision was normal. Visual fields were normal   General: The patient is pregnant woman in no acute distress  HEENT:  Head is /AT.  Sclera are anicteric.  Funduscopic exam shows normal optic discs and retinal vessels.  Neck: No carotid bruits are noted.  The neck is nontender.  Cardiovascular: The heart has a regular rate and  rhythm with a normal S1 and S2. There were no murmurs, gallops or rubs.    Skin: Extremities are without rash or  edema.  Musculoskeletal:  Back is nontender  Neurologic Exam  Mental status: The patient is alert and oriented x 3 at the time of the examination. The patient has apparent normal recent and remote memory, with an apparently normal attention span and concentration ability.   Speech is normal.  Cranial nerves: Extraocular movements are full. Pupils are equal, round, and reactive to light and accomodation.  Visual fields are full.  Facial symmetry is present. There is good facial sensation to soft touch bilaterally.Facial strength is normal.  Trapezius and sternocleidomastoid strength is normal. No dysarthria is noted.  The tongue is midline, and the patient has symmetric elevation of the soft palate. No obvious hearing deficits are noted.  Motor:  Muscle bulk is normal.   Tone is normal. Strength is  5 / 5 in all 4 extremities.   Sensory: Sensory testing is intact to pinprick, soft touch and vibration sensation in all 4 extremities.  Coordination: Cerebellar testing reveals good finger-nose-finger and heel-to-shin bilaterally.  Gait and station: Station is normal.   Gait is normal. Tandem gait is normal. Romberg  is negative.   Reflexes: Deep tendon reflexes are symmetric and normal bilaterally.       DIAGNOSTIC DATA (LABS, IMAGING, TESTING) - I reviewed patient records, labs, notes, testing and imaging myself where available.  Lab Results  Component Value Date   WBC 9.2 05/04/2024   HGB 12.5 05/04/2024   HCT 37.3 05/04/2024   MCV 95 05/04/2024   PLT 223 05/04/2024      Component Value Date/Time   NA 139 01/22/2020 1309   K 3.1 (L) 01/22/2020 1309   CL 99 01/22/2020 1309   CO2 22 08/24/2018 0031   GLUCOSE 145 (H) 01/22/2020 1309   BUN 4 01/22/2020 1309   CREATININE 0.60 01/22/2020 1309   CALCIUM 10.0 08/24/2018 0031   PROT 7.8 08/24/2018 0031   ALBUMIN 4.7 08/24/2018 0031   AST 37 08/24/2018 0031   ALT 45 (H) 08/24/2018 0031   ALKPHOS 107 08/24/2018 0031   BILITOT 0.7 08/24/2018 0031   GFRNONAA NOT CALCULATED 08/24/2018 0031   GFRAA NOT CALCULATED 08/24/2018 0031   Lab Results  Component Value Date   CHOL 143 06/25/2018   HDL 48 06/25/2018   LDLCALC 84 06/25/2018   TRIG 55 06/25/2018   CHOLHDL 3.0 06/25/2018   Lab Results  Component Value Date   HGBA1C 4.9 05/04/2024   Lab Results  Component Value Date   VITAMINB12 312 05/04/2024   Lab Results  Component Value Date   TSH 0.890 07/21/2018       ASSESSMENT AND PLAN  Visual disturbance  Acephalgic migraine  Pregnancy, unspecified gestational age   In summary, Ms. Sardinha is a 18 year old woman who is currently 6 months pregnant who has had 7 or 8 episodes of visual disturbance involving the right visual field over the last 2 years, 3 of them since her current pregnancy started.  Her exam today is normal.  The etiology is uncertain.  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  She is seeing ophthalmology and was told her eye exam was normal.  As these events are uncommon and she is currently pregnant, I would not initiate any therapy at this time.  If they become more frequent  consider a beta-blocker or calcium channel blocker.  I  will hold off on imaging at this time.  However, if the symptoms continue to occur after she delivers, we will check an MRI of the brain to further evaluate and determine if there has been any ischemic changes.  Thank you for asking me to see this patient.  Please just let me know if I can be of further assistance with her other patients in the future.  Samarie Pinder A. Vear, MD, [facility] 07/12/2024, 8:05 PM Certified in Neurology, Clinical Neurophysiology, Sleep Medicine and Neuroimaging  [facility] [address] [place], [place] [address] [phone]

## 2024-07-22 ENCOUNTER — Encounter: Payer: Self-pay | Admitting: Certified Nurse Midwife

## 2024-07-22 DIAGNOSIS — G43109 Migraine with aura, not intractable, without status migrainosus: Secondary | ICD-10-CM | POA: Insufficient documentation

## 2024-07-25 ENCOUNTER — Other Ambulatory Visit: Payer: Self-pay

## 2024-07-25 ENCOUNTER — Encounter: Payer: Self-pay | Admitting: Certified Nurse Midwife

## 2024-07-25 MED ORDER — ONDANSETRON 4 MG PO TBDP: 4.0000 mg | ORAL_TABLET | Freq: Three times a day (TID) | ORAL | 0 refills | Status: DC | PRN

## 2024-08-09 ENCOUNTER — Other Ambulatory Visit: Payer: Self-pay

## 2024-08-09 DIAGNOSIS — Z3493 Encounter for supervision of normal pregnancy, unspecified, third trimester: Secondary | ICD-10-CM

## 2024-08-10 ENCOUNTER — Other Ambulatory Visit (HOSPITAL_COMMUNITY)
Admission: RE | Admit: 2024-08-10 | Discharge: 2024-08-10 | Disposition: A | Discharge status: Home / Self Care | Source: Ambulatory Visit | Attending: Certified Nurse Midwife | Admitting: Certified Nurse Midwife

## 2024-08-10 ENCOUNTER — Other Ambulatory Visit

## 2024-08-10 ENCOUNTER — Other Ambulatory Visit: Payer: Self-pay

## 2024-08-10 ENCOUNTER — Ambulatory Visit (INDEPENDENT_AMBULATORY_CARE_PROVIDER_SITE_OTHER): Admitting: Certified Nurse Midwife

## 2024-08-10 VITALS — BP 105/58 | HR 72 | Wt 127.0 lb

## 2024-08-10 DIAGNOSIS — O219 Vomiting of pregnancy, unspecified: Secondary | ICD-10-CM | POA: Diagnosis not present

## 2024-08-10 DIAGNOSIS — N898 Other specified noninflammatory disorders of vagina: Secondary | ICD-10-CM

## 2024-08-10 DIAGNOSIS — Z3493 Encounter for supervision of normal pregnancy, unspecified, third trimester: Secondary | ICD-10-CM

## 2024-08-10 DIAGNOSIS — Z3A27 27 weeks gestation of pregnancy: Secondary | ICD-10-CM

## 2024-08-10 DIAGNOSIS — Z3492 Encounter for supervision of normal pregnancy, unspecified, second trimester: Secondary | ICD-10-CM

## 2024-08-10 NOTE — Progress Notes (Signed)
 PRENATAL VISIT NOTE  Subjective:  Wendy Short is a 18 y.o. G3P1011 at [redacted]w[redacted]d being seen today for ongoing prenatal care.  She is currently monitored for the following issues for this low-risk pregnancy and has MDD (major depressive disorder), recurrent severe, without psychosis (HCC); Chronic post-traumatic stress disorder (PTSD); Rubella non-immune status, antepartum; Supervision of low-risk pregnancy; Choroid plexus cyst of fetus affecting care of mother, antepartum; and Migraine with aura on their problem list.  Patient reports white/yellow thick vaginal discharge with a sour odor that started 3 days ago. She was recently taking amoxicillin for a week and 3 days after a dental procedure and thinks this may be related. She does not report any vaginal bleeding or irritation.  Contractions: Not present. Vag. Bleeding: None.  Movement: Present. Denies leaking of fluid.   The following portions of the patient's history were reviewed and updated as appropriate: allergies, current medications, past family history, past medical history, past social history, past surgical history and problem list.   Objective:   Vitals:   08/10/24 1134  BP: (!) 105/58  Pulse: 72  Weight: 57.6 kg   Fetal Status:  Fetal Heart Rate (bpm): 72 Fundal Height: 27 cm Movement: Present    General: Alert, oriented and cooperative. Patient is in no acute distress.  Skin: Skin is warm and dry. No rash noted.   Cardiovascular: Normal heart rate noted  Respiratory: Normal respiratory effort, no problems with respiration noted  Abdomen: Soft, gravid, appropriate for gestational age.  Pain/Pressure: Absent     Pelvic: Cervical exam deferred        Extremities: Normal range of motion.  Edema: None  Mental Status: Normal mood and affect. Normal behavior. Normal judgment and thought content.      05/04/2024    5:24 PM 07/03/2021    4:30 PM 06/11/2021    5:30 PM  Depression screen PHQ 2/9  Decreased Interest 0 0 0   Down, Depressed, Hopeless 0 0 0  PHQ - 2 Score 0 0 0  Altered sleeping 0 1 0  Tired, decreased energy 1 1 1   Change in appetite 1 1 1   Feeling bad or failure about yourself  0 0 0  Trouble concentrating 0 0 0  Moving slowly or fidgety/restless 0 0 0  Suicidal thoughts 0 0 0  PHQ-9 Score 2  3  2    Difficult doing work/chores  Not difficult at all      Data saved with a previous flowsheet row definition        05/04/2024    5:24 PM 07/03/2021    4:30 PM 06/11/2021    5:30 PM 12/12/2020    1:28 PM  GAD 7 : Generalized Anxiety Score  Nervous, Anxious, on Edge 1 0 1 0  Control/stop worrying 0 0 1 0  Worry too much - different things 0 0 1 0  Trouble relaxing 0 1 0 0  Restless 0 0 0 0  Easily annoyed or irritable 1 1 1  0  Afraid - awful might happen 0 0 1 0  Total GAD 7 Score 2 2 5  0    Assessment and Plan:  Pregnancy: G3P1011 at [redacted]w[redacted]d 1. Encounter for supervision of low-risk pregnancy in second trimester (Primary) - Doing well, feeling regular and vigorous fetal movement   2. [redacted] weeks gestation of pregnancy - Routine OB care  - Unable to complete 2hr GTT due to vomiting. Will reschedule as soon as possible.  3. Vaginal odor - Self  vaginal swab collected today based on patient symptoms.  - Will communicate results via MyChart  Preterm labor symptoms and general obstetric precautions including but not limited to vaginal bleeding, contractions, leaking of fluid and fetal movement were reviewed in detail with the patient. Please refer to After Visit Summary for other counseling recommendations.   Return in about 2 weeks (around 08/24/2024) for OB w/ CNM + 2 HR GTT.   Devyne Hauger, WHNP-S

## 2024-08-11 ENCOUNTER — Encounter: Payer: Self-pay | Admitting: Certified Nurse Midwife

## 2024-08-11 LAB — CERVICOVAGINAL ANCILLARY ONLY
Bacterial Vaginitis (gardnerella): NEGATIVE
Candida Glabrata: NEGATIVE
Candida Vaginitis: NEGATIVE
Chlamydia: NEGATIVE
Comment: NEGATIVE
Comment: NEGATIVE
Comment: NEGATIVE
Comment: NEGATIVE
Comment: NEGATIVE
Comment: NORMAL
Neisseria Gonorrhea: NEGATIVE
Trichomonas: NEGATIVE

## 2024-08-11 MED ORDER — ONDANSETRON 4 MG PO TBDP: 4.0000 mg | ORAL_TABLET | Freq: Three times a day (TID) | ORAL | 0 refills | Status: DC | PRN

## 2024-08-12 LAB — CBC
Hematocrit: 31.8 % — ABNORMAL LOW (ref 34.0–46.6)
Hemoglobin: 10.3 g/dL — ABNORMAL LOW (ref 11.1–15.9)
MCH: 30.4 pg (ref 26.6–33.0)
MCHC: 32.4 g/dL (ref 31.5–35.7)
MCV: 94 fL (ref 79–97)
Platelets: 216 x10E3/uL (ref 150–450)
RBC: 3.39 x10E6/uL — ABNORMAL LOW (ref 3.77–5.28)
RDW: 11.6 % — ABNORMAL LOW (ref 11.7–15.4)
WBC: 7.4 x10E3/uL (ref 3.4–10.8)

## 2024-08-12 LAB — HIV ANTIBODY (ROUTINE TESTING W REFLEX): HIV Screen 4th Generation wRfx: NONREACTIVE

## 2024-08-12 LAB — RPR: RPR Ser Ql: NONREACTIVE

## 2024-08-23 ENCOUNTER — Other Ambulatory Visit: Payer: Self-pay

## 2024-08-23 DIAGNOSIS — Z3A29 29 weeks gestation of pregnancy: Secondary | ICD-10-CM

## 2024-08-24 ENCOUNTER — Other Ambulatory Visit: Payer: Self-pay

## 2024-08-24 ENCOUNTER — Ambulatory Visit: Admitting: Certified Nurse Midwife

## 2024-08-24 ENCOUNTER — Other Ambulatory Visit

## 2024-08-24 VITALS — BP 111/55 | HR 79 | Wt 129.1 lb

## 2024-08-24 DIAGNOSIS — Z3A30 30 weeks gestation of pregnancy: Secondary | ICD-10-CM | POA: Diagnosis not present

## 2024-08-24 DIAGNOSIS — Z3493 Encounter for supervision of normal pregnancy, unspecified, third trimester: Secondary | ICD-10-CM

## 2024-08-24 DIAGNOSIS — Z3A29 29 weeks gestation of pregnancy: Secondary | ICD-10-CM

## 2024-08-25 LAB — GLUCOSE TOLERANCE, 2 HOURS W/ 1HR
Glucose, 1 hour: 133 mg/dL (ref 70–179)
Glucose, 2 hour: 110 mg/dL (ref 70–152)
Glucose, Fasting: 79 mg/dL (ref 70–91)

## 2024-08-27 NOTE — Progress Notes (Signed)
 PRENATAL VISIT NOTE  Subjective:  Wendy Short is a 18 y.o. G3P1011 at [redacted]w[redacted]d being seen today for ongoing prenatal care.  She is currently monitored for the following issues for this low-risk pregnancy and has MDD (major depressive disorder), recurrent severe, without psychosis (HCC); Chronic post-traumatic stress disorder (PTSD); Rubella non-immune status, antepartum; Supervision of low-risk pregnancy; Choroid plexus cyst of fetus affecting care of mother, antepartum; and Migraine with aura on their problem list.  Patient reports no complaints.  Contractions: Not present. Vag. Bleeding: None.  Movement: Present. Denies leaking of fluid.   The following portions of the patient's history were reviewed and updated as appropriate: allergies, current medications, past family history, past medical history, past social history, past surgical history and problem list.   Objective:   Vitals:   08/24/24 1116  BP: (!) 111/55  Pulse: 79  Weight: 129 lb 1.6 oz (58.6 kg)    Fetal Status:  Fetal Heart Rate (bpm): 135   Movement: Present    General: Alert, oriented and cooperative. Patient is in no acute distress.  Skin: Skin is warm and dry. No rash noted.   Cardiovascular: Normal heart rate noted  Respiratory: Normal respiratory effort, no problems with respiration noted  Abdomen: Soft, gravid, appropriate for gestational age.  Pain/Pressure: Absent     Pelvic: Cervical exam deferred        Extremities: Normal range of motion.  Edema: None  Mental Status: Normal mood and affect. Normal behavior. Normal judgment and thought content.      05/04/2024    5:24 PM 07/03/2021    4:30 PM 06/11/2021    5:30 PM  Depression screen PHQ 2/9  Decreased Interest 0 0 0  Down, Depressed, Hopeless 0 0 0  PHQ - 2 Score 0 0 0  Altered sleeping 0 1 0  Tired, decreased energy 1 1 1   Change in appetite 1 1 1   Feeling bad or failure about yourself  0 0 0  Trouble concentrating 0 0 0  Moving slowly or  fidgety/restless 0 0 0  Suicidal thoughts 0 0 0  PHQ-9 Score 2  3  2    Difficult doing work/chores  Not difficult at all      Data saved with a previous flowsheet row definition        05/04/2024    5:24 PM 07/03/2021    4:30 PM 06/11/2021    5:30 PM 12/12/2020    1:28 PM  GAD 7 : Generalized Anxiety Score  Nervous, Anxious, on Edge 1 0 1 0  Control/stop worrying 0 0 1 0  Worry too much - different things 0 0 1 0  Trouble relaxing 0 1 0 0  Restless 0 0 0 0  Easily annoyed or irritable 1 1 1  0  Afraid - awful might happen 0 0 1 0  Total GAD 7 Score 2 2 5  0    Assessment and Plan:  Pregnancy: G3P1011 at [redacted]w[redacted]d 1. Encounter for supervision of low-risk pregnancy in third trimester (Primary) - Doing well, feeling regular and vigorous fetal movement   2. [redacted] weeks gestation of pregnancy - Routine OB care, did well with GTT today  Preterm labor symptoms and general obstetric precautions including but not limited to vaginal bleeding, contractions, leaking of fluid and fetal movement were reviewed in detail with the patient. Please refer to After Visit Summary for other counseling recommendations.   Return in about 2 weeks (around 09/07/2024) for IN-PERSON, LOB.  Future Appointments  Date Time Provider Department Center  09/14/2024 11:15 AM Vannie Cornell SAUNDERS, CNM [facility]    Cornell SAUNDERS Vannie, CNM

## 2024-09-14 ENCOUNTER — Other Ambulatory Visit: Payer: Self-pay

## 2024-09-14 ENCOUNTER — Ambulatory Visit (INDEPENDENT_AMBULATORY_CARE_PROVIDER_SITE_OTHER): Payer: Self-pay | Admitting: Certified Nurse Midwife

## 2024-09-14 VITALS — BP 119/56 | HR 86 | Wt 137.0 lb

## 2024-09-14 DIAGNOSIS — N907 Vulvar cyst: Secondary | ICD-10-CM

## 2024-09-14 DIAGNOSIS — Z3493 Encounter for supervision of normal pregnancy, unspecified, third trimester: Secondary | ICD-10-CM

## 2024-09-14 DIAGNOSIS — Z3A32 32 weeks gestation of pregnancy: Secondary | ICD-10-CM

## 2024-09-16 ENCOUNTER — Encounter: Payer: Self-pay | Admitting: Certified Nurse Midwife

## 2024-09-20 MED ORDER — CEFADROXIL 500 MG PO CAPS: 500.0000 mg | ORAL_CAPSULE | Freq: Two times a day (BID) | ORAL | 0 refills | Status: DC

## 2024-09-20 MED ORDER — FLUCONAZOLE 150 MG PO TABS: 150.0000 mg | ORAL_TABLET | Freq: Every day | ORAL | 1 refills | Status: DC

## 2024-09-20 NOTE — Progress Notes (Addendum)
 "  PRENATAL VISIT NOTE  Subjective:  Wendy Short is a 18 y.o. G3P1011 at [redacted]w[redacted]d being seen today for ongoing prenatal care.  She is currently monitored for the following issues for this low-risk pregnancy and has MDD (major depressive disorder), recurrent severe, without psychosis (HCC); Chronic post-traumatic stress disorder (PTSD); Rubella non-immune status, antepartum; Supervision of low-risk pregnancy; Choroid plexus cyst of fetus affecting care of mother, antepartum; and Migraine with aura on their problem list.  Patient reports continued trouble with a small cyst on her upper labia, just above the clitoris. It has opened up and drained some, but is still sore to the touch.  Contractions: Not present. Vag. Bleeding: None.  Movement: Present. Denies leaking of fluid.   The following portions of the patient's history were reviewed and updated as appropriate: allergies, current medications, past family history, past medical history, past social history, past surgical history and problem list.   Objective:   Vitals:   09/14/24 1126  BP: (!) 119/56  Pulse: 86  Weight: 137 lb (62.1 kg)   Fetal Status:  Fetal Heart Rate (bpm): 145   Movement: Present    General: Alert, oriented and cooperative. Patient is in no acute distress.  Skin: Skin is warm and dry. No rash noted.   Cardiovascular: Normal heart rate noted  Respiratory: Normal respiratory effort, no problems with respiration noted  Abdomen: Soft, gravid, appropriate for gestational age.  Pain/Pressure: Absent     Pelvic: Pelvic exam performed in the presence of a chaperone. Tender labial cyst noted just above and to the right of the clitoris. No exudate noted, but palpated a hardened tunnel-like area that traveled <0.5cm below the nodule.        Extremities: Normal range of motion.  Edema: None  Mental Status: Normal mood and affect. Normal behavior. Normal judgment and thought content.      05/04/2024    5:24 PM 07/03/2021    4:30  PM 06/11/2021    5:30 PM  Depression screen PHQ 2/9  Decreased Interest 0 0 0  Down, Depressed, Hopeless 0 0 0  PHQ - 2 Score 0 0 0  Altered sleeping 0 1 0  Tired, decreased energy 1 1 1   Change in appetite 1 1 1   Feeling bad or failure about yourself  0 0 0  Trouble concentrating 0 0 0  Moving slowly or fidgety/restless 0 0 0  Suicidal thoughts 0 0 0  PHQ-9 Score 2  3  2    Difficult doing work/chores  Not difficult at all      Data saved with a previous flowsheet row definition       05/04/2024    5:24 PM 07/03/2021    4:30 PM 06/11/2021    5:30 PM 12/12/2020    1:28 PM  GAD 7 : Generalized Anxiety Score  Nervous, Anxious, on Edge 1 0 1 0  Control/stop worrying 0 0 1 0  Worry too much - different things 0 0 1 0  Trouble relaxing 0 1 0 0  Restless 0 0 0 0  Easily annoyed or irritable 1 1 1  0  Afraid - awful might happen 0 0 1 0  Total GAD 7 Score 2 2 5  0   Assessment and Plan:  Pregnancy: G3P1011 at [redacted]w[redacted]d 1. Encounter for supervision of low-risk pregnancy in third trimester (Primary) - Doing well, feeling regular and vigorous fetal movement   2. [redacted] weeks gestation of pregnancy - Routine OB care   3. Labial  cyst - Discussed presentation/findings with Dr. Jeralyn who recommended antibiotics, pt has history of yeast after antibiotics. - cefadroxil  (DURICEF) 500 MG capsule; Take 1 capsule (500 mg total) by mouth 2 (two) times daily.  Dispense: 14 capsule; Refill: 0 - fluconazole  (DIFLUCAN ) 150 MG tablet; Take 1 tablet (150 mg total) by mouth daily. Take at the end of the antibiotic dose.  Dispense: 1 tablet; Refill: 1   Preterm labor symptoms and general obstetric precautions including but not limited to vaginal bleeding, contractions, leaking of fluid and fetal movement were reviewed in detail with the patient. Please refer to After Visit Summary for other counseling recommendations.   Return in about 2 weeks (around 09/28/2024) for IN-PERSON, LOB.  Future Appointments   Date Time Provider Department Center  09/28/2024  4:15 PM Vannie Cornell JONELLE EDDY [facility]  10/12/2024  4:15 PM Vannie Cornell JONELLE EDDY [facility]  10/19/2024  3:55 PM Vannie Cornell JONELLE EDDY [facility]  10/26/2024 11:15 AM Vannie Cornell JONELLE, CNM [facility]  11/02/2024  3:15 PM Vannie Cornell JONELLE EDDY [facility]  11/09/2024 10:55 AM Vannie Cornell JONELLE, CNM [facility]    Cornell JONELLE Vannie, CNM "

## 2024-09-20 NOTE — Addendum Note (Signed)
 Addended by: VANNIE MATAR on: 09/20/2024 10:18 PM   Modules accepted: Orders

## 2024-09-28 ENCOUNTER — Other Ambulatory Visit: Payer: Self-pay

## 2024-09-28 ENCOUNTER — Ambulatory Visit: Payer: Self-pay | Admitting: Certified Nurse Midwife

## 2024-09-28 VITALS — BP 107/67 | HR 103 | Wt 141.3 lb

## 2024-09-28 DIAGNOSIS — O219 Vomiting of pregnancy, unspecified: Secondary | ICD-10-CM | POA: Diagnosis not present

## 2024-09-28 DIAGNOSIS — Z3A34 34 weeks gestation of pregnancy: Secondary | ICD-10-CM | POA: Diagnosis not present

## 2024-09-28 DIAGNOSIS — Z3493 Encounter for supervision of normal pregnancy, unspecified, third trimester: Secondary | ICD-10-CM

## 2024-09-28 MED ORDER — ONDANSETRON 4 MG PO TBDP: 4.0000 mg | ORAL_TABLET | Freq: Three times a day (TID) | ORAL | 1 refills | Status: DC | PRN

## 2024-09-28 NOTE — Progress Notes (Signed)
 "  PRENATAL VISIT NOTE  Subjective:  Wendy Short is a 18 y.o. G3P1011 at [redacted]w[redacted]d being seen today for ongoing prenatal care.  She is currently monitored for the following issues for this low-risk pregnancy and has MDD (major depressive disorder), recurrent severe, without psychosis (HCC); Chronic post-traumatic stress disorder (PTSD); Rubella non-immune status, antepartum; Supervision of low-risk pregnancy; Choroid plexus cyst of fetus affecting care of mother, antepartum; and Migraine with aura on their problem list.  Patient reports nausea.  Contractions: Not present. Vag. Bleeding: None.  Movement: Present. Denies leaking of fluid.   The following portions of the patient's history were reviewed and updated as appropriate: allergies, current medications, past family history, past medical history, past social history, past surgical history and problem list.   Objective:   Vitals:   09/28/24 1640  BP: 107/67  Pulse: (!) 103  Weight: 141 lb 4.8 oz (64.1 kg)    Fetal Status:  Fetal Heart Rate (bpm): 138 Fundal Height: 34 cm Movement: Present    General: Alert, oriented and cooperative. Patient is in no acute distress.  Skin: Skin is warm and dry. No rash noted.   Cardiovascular: Normal heart rate noted  Respiratory: Normal respiratory effort, no problems with respiration noted  Abdomen: Soft, gravid, appropriate for gestational age.  Pain/Pressure: Absent     Pelvic: Cervical exam deferred        Extremities: Normal range of motion.  Edema: None  Mental Status: Normal mood and affect. Normal behavior. Normal judgment and thought content.      05/04/2024    5:24 PM 07/03/2021    4:30 PM 06/11/2021    5:30 PM  Depression screen PHQ 2/9  Decreased Interest 0 0 0  Down, Depressed, Hopeless 0 0 0  PHQ - 2 Score 0 0 0  Altered sleeping 0 1 0  Tired, decreased energy 1 1 1   Change in appetite 1 1 1   Feeling bad or failure about yourself  0 0 0  Trouble concentrating 0 0 0  Moving  slowly or fidgety/restless 0 0 0  Suicidal thoughts 0 0 0  PHQ-9 Score 2  3  2    Difficult doing work/chores  Not difficult at all      Data saved with a previous flowsheet row definition        05/04/2024    5:24 PM 07/03/2021    4:30 PM 06/11/2021    5:30 PM 12/12/2020    1:28 PM  GAD 7 : Generalized Anxiety Score  Nervous, Anxious, on Edge 1 0 1 0  Control/stop worrying 0 0 1 0  Worry too much - different things 0 0 1 0  Trouble relaxing 0 1 0 0  Restless 0 0 0 0  Easily annoyed or irritable 1 1 1  0  Afraid - awful might happen 0 0 1 0  Total GAD 7 Score 2 2 5  0    Assessment and Plan:  Pregnancy: G3P1011 at [redacted]w[redacted]d 1. Encounter for supervision of low-risk pregnancy in third trimester (Primary) - Doing well, feeling regular and vigorous fetal movement   2. [redacted] weeks gestation of pregnancy - Routine OB care   3. Nausea/vomiting in pregnancy - Feeling nauseated in the afternoon/evenings, already taking miralax  daily - ondansetron  (ZOFRAN -ODT) 4 MG disintegrating tablet; Take 1 tablet (4 mg total) by mouth every 8 (eight) hours as needed for nausea or vomiting.  Dispense: 30 tablet; Refill: 1  Preterm labor symptoms and general obstetric precautions including but not  limited to vaginal bleeding, contractions, leaking of fluid and fetal movement were reviewed in detail with the patient. Please refer to After Visit Summary for other counseling recommendations.   Return in about 2 weeks (around 10/12/2024) for IN-PERSON, LOB/GBS.  Future Appointments  Date Time Provider Department Center  10/12/2024  4:15 PM Vannie Cornell JONELLE EDDY [facility]  10/19/2024  3:55 PM Vannie Cornell JONELLE EDDY [facility]  10/26/2024 11:15 AM Vannie Cornell JONELLE, CNM [facility]  11/02/2024  3:15 PM Vannie Cornell JONELLE EDDY [facility]  11/09/2024 10:55 AM Vannie Cornell JONELLE, CNM [facility]   Cornell JONELLE Vannie, CNM "

## 2024-09-29 NOTE — L&D Delivery Note (Addendum)
 Delivery Note Wendy Short is a 19 y.o. G3P1011 at [redacted]w[redacted]d admitted for PROM (clear) at 1900 last night, contractions began at 0400.   GBS Status:   Unknown; Received Penicillin  x 1  Labor course: Initial SVE: 4/50/-3. Augmentation with: None. She then progressed to complete.  ROM: 24h 67m with clear fluid  Birth: After a 15 minute 2nd stage, she delivered a Live born female  APGAR: 8, 18  Newborn Delivery   Birth date/time: 10/14/2024 19:21:00 Delivery type: Vaginal, Spontaneous        Delivered via spontaneous vaginal delivery  (waterbirth) (Presentation: Cephalic ). Nuchal cord present: No. Shoulders and body delivered in usual fashion. Infant placed directly on mom's abdomen for bonding/skin-to-skin, baby dried and stimulated. Cord clamped x 2 after pulsation ceased and cut by father of baby.  Cord blood collected. Placenta delivered-Spontaneous with 3 vessels. 20u Pitocin  and 500cc LR given as a bolus prior to delivery of placenta.  Fundus firm with massage. Placenta inspected and appears to be intact with a 3 VC.  Sponge and instrument count were correct x2.  Intrapartum complications:  None Anesthesia:  lidocaine  Lacerations:  left labial  Suture Repair: 4-0 Monocryl and 4-0 Vicryl EBL (mL):100 mL  Newborn Data: Birth date:10/14/2024 Birth time:7:21 PM Gender:Female Living status:Living Apgars:8 ,9  Tzphyu:7362 g    Mom to postpartum.  Baby to Couplet care / Skin to Skin. Placenta to Home with patient   Plans to Breastfeed Contraception: None Circumcision: N/A  Note sent to [facility]: [facility] for pp visit.  Delivery Report:   Review the Delivery Report for details.     Signed: Kaislee Chao, MSN,CNM 10/14/2024, 10:08 PM

## 2024-10-12 ENCOUNTER — Other Ambulatory Visit (HOSPITAL_COMMUNITY)
Admission: RE | Admit: 2024-10-12 | Discharge: 2024-10-12 | Disposition: A | Discharge status: Home / Self Care | Source: Ambulatory Visit | Attending: Certified Nurse Midwife | Admitting: Certified Nurse Midwife

## 2024-10-12 ENCOUNTER — Encounter: Payer: Self-pay | Admitting: Certified Nurse Midwife

## 2024-10-12 ENCOUNTER — Ambulatory Visit: Payer: Self-pay | Admitting: Certified Nurse Midwife

## 2024-10-12 ENCOUNTER — Other Ambulatory Visit: Payer: Self-pay

## 2024-10-12 VITALS — BP 120/69 | HR 111 | Wt 142.0 lb

## 2024-10-12 DIAGNOSIS — N898 Other specified noninflammatory disorders of vagina: Secondary | ICD-10-CM

## 2024-10-12 DIAGNOSIS — O26893 Other specified pregnancy related conditions, third trimester: Secondary | ICD-10-CM

## 2024-10-12 DIAGNOSIS — Z3493 Encounter for supervision of normal pregnancy, unspecified, third trimester: Secondary | ICD-10-CM

## 2024-10-12 DIAGNOSIS — Z3A36 36 weeks gestation of pregnancy: Secondary | ICD-10-CM | POA: Diagnosis not present

## 2024-10-12 LAB — POCT HEMOGLOBIN-HEMACUE: Hemoglobin: 8.1 g/dL — ABNORMAL LOW (ref 12.0–15.0)

## 2024-10-13 ENCOUNTER — Other Ambulatory Visit: Payer: Self-pay

## 2024-10-14 ENCOUNTER — Inpatient Hospital Stay (HOSPITAL_COMMUNITY)
Admission: AD | Admit: 2024-10-14 | Discharge: 2024-10-16 | DRG: 806 | Disposition: A | Discharge status: Home / Self Care | Attending: Family Medicine | Admitting: Family Medicine

## 2024-10-14 ENCOUNTER — Encounter (HOSPITAL_COMMUNITY): Payer: Self-pay | Admitting: Obstetrics and Gynecology

## 2024-10-14 ENCOUNTER — Other Ambulatory Visit: Payer: Self-pay

## 2024-10-14 DIAGNOSIS — O3503X0 Maternal care for (suspected) central nervous system malformation or damage in fetus, choroid plexus cysts, not applicable or unspecified: Secondary | ICD-10-CM | POA: Diagnosis present

## 2024-10-14 DIAGNOSIS — O4202 Full-term premature rupture of membranes, onset of labor within 24 hours of rupture: Principal | ICD-10-CM

## 2024-10-14 DIAGNOSIS — Z87891 Personal history of nicotine dependence: Secondary | ICD-10-CM | POA: Diagnosis not present

## 2024-10-14 DIAGNOSIS — D62 Acute posthemorrhagic anemia: Secondary | ICD-10-CM | POA: Diagnosis not present

## 2024-10-14 DIAGNOSIS — Z3490 Encounter for supervision of normal pregnancy, unspecified, unspecified trimester: Secondary | ICD-10-CM

## 2024-10-14 DIAGNOSIS — D649 Anemia, unspecified: Secondary | ICD-10-CM

## 2024-10-14 DIAGNOSIS — O9081 Anemia of the puerperium: Secondary | ICD-10-CM | POA: Diagnosis not present

## 2024-10-14 DIAGNOSIS — O4292 Full-term premature rupture of membranes, unspecified as to length of time between rupture and onset of labor: Secondary | ICD-10-CM | POA: Diagnosis present

## 2024-10-14 DIAGNOSIS — Z3A37 37 weeks gestation of pregnancy: Secondary | ICD-10-CM | POA: Diagnosis not present

## 2024-10-14 LAB — CBC
HCT: 32.2 % — ABNORMAL LOW (ref 36.0–46.0)
Hemoglobin: 10.1 g/dL — ABNORMAL LOW (ref 12.0–15.0)
MCH: 26.4 pg (ref 26.0–34.0)
MCHC: 31.4 g/dL (ref 30.0–36.0)
MCV: 84.3 fL (ref 80.0–100.0)
Platelets: 280 K/uL (ref 150–400)
RBC: 3.82 MIL/uL — ABNORMAL LOW (ref 3.87–5.11)
RDW: 14.8 % (ref 11.5–15.5)
WBC: 11.1 K/uL — ABNORMAL HIGH (ref 4.0–10.5)
nRBC: 0 % (ref 0.0–0.2)

## 2024-10-14 LAB — CERVICOVAGINAL ANCILLARY ONLY
Bacterial Vaginitis (gardnerella): NEGATIVE
Candida Glabrata: NEGATIVE
Candida Vaginitis: NEGATIVE
Chlamydia: NEGATIVE
Comment: NEGATIVE
Comment: NEGATIVE
Comment: NEGATIVE
Comment: NEGATIVE
Comment: NEGATIVE
Comment: NORMAL
Neisseria Gonorrhea: NEGATIVE
Trichomonas: NEGATIVE

## 2024-10-14 LAB — POCT FERN TEST: POCT Fern Test: POSITIVE

## 2024-10-14 LAB — TYPE AND SCREEN
ABO/RH(D): O POS
Antibody Screen: NEGATIVE

## 2024-10-14 MED ORDER — FLEET ENEMA RE ENEM: 1.0000 | ENEMA | RECTAL | Status: DC | PRN

## 2024-10-14 MED ORDER — ONDANSETRON HCL 4 MG/2ML IJ SOLN: 4.0000 mg | INTRAMUSCULAR | Status: DC | PRN

## 2024-10-14 MED ORDER — LIDOCAINE HCL (PF) 1 % IJ SOLN
30.0000 mL | INTRAMUSCULAR | Status: AC | PRN
  Administered 2024-10-14: 30 mL via SUBCUTANEOUS
  Filled 2024-10-14: qty 30

## 2024-10-14 MED ORDER — SOD CITRATE-CITRIC ACID 500-334 MG/5ML PO SOLN: 30.0000 mL | ORAL | Status: DC | PRN

## 2024-10-14 MED ORDER — MEDROXYPROGESTERONE ACETATE 150 MG/ML IM SUSP: 150.0000 mg | INTRAMUSCULAR | Status: DC | PRN

## 2024-10-14 MED ORDER — OXYCODONE HCL 5 MG PO TABS: 10.0000 mg | ORAL_TABLET | ORAL | Status: DC | PRN

## 2024-10-14 MED ORDER — SODIUM CHLORIDE 0.9 % IV SOLN
5.0000 10*6.[IU] | Freq: Once | INTRAVENOUS | Status: AC
  Administered 2024-10-14: 5 10*6.[IU] via INTRAVENOUS
  Filled 2024-10-14: qty 5

## 2024-10-14 MED ORDER — TETANUS-DIPHTH-ACELL PERTUSSIS 5-2-15.5 LF-MCG/0.5 IM SUSP: 0.5000 mL | Freq: Once | INTRAMUSCULAR | Status: DC

## 2024-10-14 MED ORDER — SODIUM CHLORIDE 0.9% FLUSH: 3.0000 mL | Freq: Two times a day (BID) | INTRAVENOUS | Status: DC

## 2024-10-14 MED ORDER — OXYCODONE-ACETAMINOPHEN 5-325 MG PO TABS: 1.0000 | ORAL_TABLET | ORAL | Status: DC | PRN

## 2024-10-14 MED ORDER — OXYTOCIN-SODIUM CHLORIDE 30-0.9 UT/500ML-% IV SOLN
2.5000 [IU]/h | INTRAVENOUS | Status: DC
  Administered 2024-10-14: 2.5 [IU]/h via INTRAVENOUS
  Filled 2024-10-14: qty 500

## 2024-10-14 MED ORDER — DIPHENHYDRAMINE HCL 25 MG PO CAPS: 25.0000 mg | ORAL_CAPSULE | Freq: Four times a day (QID) | ORAL | Status: DC | PRN

## 2024-10-14 MED ORDER — COCONUT OIL OIL: 1.0000 | TOPICAL_OIL | Status: DC | PRN

## 2024-10-14 MED ORDER — SIMETHICONE 80 MG PO CHEW: 80.0000 mg | CHEWABLE_TABLET | ORAL | Status: DC | PRN

## 2024-10-14 MED ORDER — OXYCODONE-ACETAMINOPHEN 5-325 MG PO TABS: 2.0000 | ORAL_TABLET | ORAL | Status: DC | PRN

## 2024-10-14 MED ORDER — SODIUM CHLORIDE 0.9% FLUSH: 3.0000 mL | INTRAVENOUS | Status: DC | PRN

## 2024-10-14 MED ORDER — DIBUCAINE (PERIANAL) 1 % EX OINT: 1.0000 | TOPICAL_OINTMENT | CUTANEOUS | Status: DC | PRN

## 2024-10-14 MED ORDER — ONDANSETRON HCL 4 MG/2ML IJ SOLN: 4.0000 mg | Freq: Four times a day (QID) | INTRAMUSCULAR | Status: DC | PRN

## 2024-10-14 MED ORDER — IBUPROFEN 600 MG PO TABS
600.0000 mg | ORAL_TABLET | Freq: Four times a day (QID) | ORAL | Status: DC
  Administered 2024-10-15 – 2024-10-16 (×5): 600 mg via ORAL
  Filled 2024-10-14 (×5): qty 1

## 2024-10-14 MED ORDER — WITCH HAZEL-GLYCERIN EX PADS: 1.0000 | MEDICATED_PAD | CUTANEOUS | Status: DC | PRN

## 2024-10-14 MED ORDER — DOCUSATE SODIUM 100 MG PO CAPS
100.0000 mg | ORAL_CAPSULE | Freq: Two times a day (BID) | ORAL | Status: DC
  Administered 2024-10-15 – 2024-10-16 (×2): 100 mg via ORAL
  Filled 2024-10-14 (×2): qty 1

## 2024-10-14 MED ORDER — PRENATAL MULTIVITAMIN CH
1.0000 | ORAL_TABLET | Freq: Every day | ORAL | Status: DC
  Administered 2024-10-15 – 2024-10-16 (×2): 1 via ORAL
  Filled 2024-10-14 (×2): qty 1

## 2024-10-14 MED ORDER — ONDANSETRON HCL 4 MG PO TABS: 4.0000 mg | ORAL_TABLET | ORAL | Status: DC | PRN

## 2024-10-14 MED ORDER — OXYCODONE HCL 5 MG PO TABS: 5.0000 mg | ORAL_TABLET | ORAL | Status: DC | PRN

## 2024-10-14 MED ORDER — SODIUM CHLORIDE 0.9 % IV SOLN: 250.0000 mL | INTRAVENOUS | Status: DC | PRN

## 2024-10-14 MED ORDER — FERROUS SULFATE 325 (65 FE) MG PO TABS
325.0000 mg | ORAL_TABLET | ORAL | Status: DC
  Administered 2024-10-15: 325 mg via ORAL
  Filled 2024-10-14: qty 1

## 2024-10-14 MED ORDER — FENTANYL CITRATE (PF) 100 MCG/2ML IJ SOLN: 50.0000 ug | INTRAMUSCULAR | Status: DC | PRN

## 2024-10-14 MED ORDER — PENICILLIN G POT IN DEXTROSE 60000 UNIT/ML IV SOLN
3.0000 10*6.[IU] | INTRAVENOUS | Status: DC
  Filled 2024-10-14 (×4): qty 50

## 2024-10-14 MED ORDER — LACTATED RINGERS IV SOLN: 500.0000 mL | INTRAVENOUS | Status: DC | PRN

## 2024-10-14 MED ORDER — MEASLES, MUMPS & RUBELLA VAC ~~LOC~~ SUSR: 0.5000 mL | Freq: Once | SUBCUTANEOUS | Status: DC

## 2024-10-14 MED ORDER — OXYTOCIN BOLUS FROM INFUSION
333.0000 mL | Freq: Once | INTRAVENOUS | Status: AC
  Administered 2024-10-14: 333 mL via INTRAVENOUS

## 2024-10-14 MED ORDER — ACETAMINOPHEN 325 MG PO TABS: 650.0000 mg | ORAL_TABLET | ORAL | Status: DC | PRN

## 2024-10-14 MED ORDER — BENZOCAINE-MENTHOL 20-0.5 % EX AERO
1.0000 | INHALATION_SPRAY | CUTANEOUS | Status: DC | PRN
  Administered 2024-10-15: 1 via TOPICAL
  Filled 2024-10-14: qty 56

## 2024-10-14 NOTE — Lactation Note (Signed)
 This note was copied from a baby's chart. Lactation Consultation Note  Patient Name: Wendy Short Unijb'd Date: 10/14/2024 Age:21 hours Reason for consult: Initial assessment;Early term 37-38.6wks;Infant < 6lbs  P2- MOB reported that infant had latched multiple times since birth and it has been great. MOB denies painful or uncomfortable latches. MOB feels like she does not need to be seen by [facility] team because breastfeeding comes naturally to her. LC sent [facility] referral per MOB request. LC encouraged keeping the feedings 30 minutes or less to help conserve energy due to low birth weight.   LC reviewed the first 24 hr birthday nap, day 2 cluster feeding, feeding infant on cue 8-12x in 24 hrs, not allowing infant to go over 3 hrs without a feeding, CDC milk storage guidelines, LC services handout and engorgement/breast care. LC encouraged MOB to call for further assistance as needed.  Maternal Data Has patient been taught Hand Expression?: No Does the patient have breastfeeding experience prior to this delivery?: Yes How long did the patient breastfeed?: 2 years  Feeding Mother's Current Feeding Choice: Breast Milk  Lactation Tools Discussed/Used Pump Education: Milk Storage  Interventions Interventions: Breast feeding basics reviewed;Education;LC Services brochure  Discharge Discharge Education: Engorgement and breast care;Warning signs for feeding baby Pump: Manual;Referral sent for [facility] Pump  Consult Status Consult Status: Complete (mother declined follow up)    Recardo Hoit BS, IBCLC 10/14/2024, 10:15 PM

## 2024-10-14 NOTE — H&P (Signed)
 OBSTETRIC ADMISSION HISTORY AND PHYSICAL  Wendy Short is a 19 y.o. female G21P1011 with IUP at [redacted]w[redacted]d by LMP presenting for PROM (clear) at 1900 last night, contractions began at 0400. She reports +FMs, No LOF, no VB, no blurry vision, headaches or peripheral edema, and RUQ pain.  She plans on breast feeding. She is unsure about birth control options. She received her prenatal care at [facility] w CNM  Dating: By LMP --->  Estimated Date of Delivery: 11/03/24  Sono:    @[redacted]w[redacted]d , CWD, normal anatomy, cephalic presentation, posterior placenta, 343g, 77% EFW  Prenatal History/Complications: None, FH did measure 2 weeks behind consistently  NURSING  PROVIDER  Office Location [facility] Dating by LMP  [facility] Model Traditional Anatomy U/S Scheduled 06/14/24  Initiated care at  10wks                Language  English              LAB RESULTS   Support Person FOB Genetics NIPS: LR female AFP: Negative    NT/IT (FT only)     Carrier Screen Horizon: neg 27/27 (06/19/20)  Rhogam  O/Positive/-- (08/06 1129) A1C/GTT Early HgbA1C: 4.9 Third trimester 2 hr GTT:   Flu Vaccine declined    TDaP Vaccine  Declined Blood Type O/Positive/-- (08/06 1129)  RSV Vaccine  Antibody Negative (08/06 1129)  COVID Vaccine  Rubella <0.90 (08/06 1129)  Feeding Plan breast RPR Non Reactive (08/06 1129)  Contraception  HBsAg Negative (08/06 1129)  Circumcision NA (girl) HIV Non Reactive (08/06 1129)  Pediatrician  Avelina Barban, MD ([place]) HCVAb Non Reactive (08/06 1129)  Prenatal Classes     BTL Consent NA Pap None (age)  BTL Pre-payment NA GC/CT Initial:   36wks:    VBAC Consent NA GBS   For PCN allergy, check sensitivities   BRx Optimized? [ ]  yes   [ ]  no    DME Rx [ ]  BP cuff [ ]  Weight Scale Waterbirth  [ ]  Class [ ]  Consent [ ]  CNM visit  PHQ9 & GAD7 [x]  new OB [  ] 28 weeks  [  ] 36 weeks Induction  [ ]  Orders Entered [ ] Foley Y/N    Past Medical History: Past Medical History:  Diagnosis  Date   Borderline personality disorder (HCC)    Depression    Poor fetal growth affecting management of mother in third trimester 11/19/2020   Pregnancy affected by fetal growth restriction 12/24/2020   PTSD (post-traumatic stress disorder)    Supervision of high risk pregnancy, antepartum 05/31/2020    Nursing Staff Provider Office Location [facility] Dating  Early US  at [redacted]w[redacted]d Language  English Anatomy US    Flu Vaccine  Declined Genetic Screen  NIPS: low risk female AFP:  Screen Negative  TDaP Vaccine  Declined 11/19/20  Hgb A1C or  GTT Early  A1C 5.3 Third trimester  COVID Vaccine no   LAB RESULTS  Rhogam  NA O+ Blood Type O/Positive/-- (09/21 1608)  Feeding Plan Breast Antibody Negative (09/21 1608) C   Supervision of low-risk first pregnancy 12/28/2020   Unplanned pregnancy 06/19/2020   Weight loss    Past Surgical History: Past Surgical History:  Procedure Laterality Date   NO PAST SURGERIES     Obstetrical History: OB History     Gravida  3   Para  1   Term  1   Preterm      AB  1   Living  1      SAB      IAB      Ectopic      Multiple  0   Live Births  1          Social History Social History   Socioeconomic History   Marital status: Single    Spouse name: Not on file   Number of children: Not on file   Years of education: Not on file   Highest education level: Not on file  Occupational History   Occupation: Student  Tobacco Use   Smoking status: Former    Current packs/day: 0.00    Average packs/day: 0.3 packs/day for 0.5 years (0.1 ttl pk-yrs)    Types: E-cigarettes, Cigarettes    Start date: 02/29/2020    Quit date: 08/29/2020    Years since quitting: 4.1   Smokeless tobacco: Never  Vaping Use   Vaping status: Former  Substance and Sexual Activity   Alcohol use: Not Currently   Drug use: Not Currently    Types: Marijuana    Comment: last use years ago   Sexual activity: Yes    Birth control/protection: None  Other Topics Concern   Not on  file  Social History Narrative   homeschooled at 1-2 grade level; currently at [employer] -- 9th grade         Social Drivers of Health   Tobacco Use: Medium Risk (10/14/2024)   Patient History    Smoking Tobacco Use: Former    Smokeless Tobacco Use: Never    Passive Exposure: Not on Actuary Strain: Not on file  Food Insecurity: No Food Insecurity (05/04/2024)   Epic    Worried About Radiation Protection Practitioner of Food in the Last Year: Never true    Ran Out of Food in the Last Year: Never true  Transportation Needs: No Transportation Needs (05/04/2024)   Epic    Lack of Transportation (Medical): No    Lack of Transportation (Non-Medical): No  Physical Activity: Not on file  Stress: Not on file  Social Connections: Not on file  Depression (PHQ2-9): Low Risk (05/04/2024)   Depression (PHQ2-9)    PHQ-2 Score: 2  Alcohol Screen: Not on file  Housing: Not on file  Utilities: Not on file  Health Literacy: Not on file   Family History: Family History  Problem Relation Age of Onset   Cancer Mother    Skin cancer Mother    Allergies Father    Asthma Sister    Colitis Paternal Grandfather    Diabetes Neg Hx    Heart disease Neg Hx    Hypertension Neg Hx    Allergies: Allergies[1]  Medications Prior to Admission  Medication Sig Dispense Refill Last Dose/Taking   ondansetron  (ZOFRAN -ODT) 4 MG disintegrating tablet Take 1 tablet (4 mg total) by mouth every 8 (eight) hours as needed for nausea or vomiting. 30 tablet 1 10/13/2024   polyethylene glycol (MIRALAX ) 17 g packet Take 17 g by mouth daily. 30 each 1 Past Month   Prenatal MV & Min w/FA-DHA (PRENATAL GUMMIES PO) Take 2 tablets by mouth daily at 6 (six) AM.   Past Week   cefadroxil  (DURICEF) 500 MG capsule Take 1 capsule (500 mg total) by mouth 2 (two) times daily. (Patient not taking: Reported on 10/14/2024) 14 capsule 0 Not Taking   fluconazole  (DIFLUCAN ) 150 MG tablet Take 1 tablet (150 mg total) by mouth daily.  Take at the end of the  antibiotic dose. (Patient not taking: Reported on 10/12/2024) 1 tablet 1    Review of Systems  All systems reviewed and negative except as stated in HPI  Blood pressure (!) 109/52, pulse 93, temperature 98.8 F (37.1 C), temperature source Oral, resp. rate 16, height 5' 5 (1.651 m), weight 146 lb (66.2 kg), last menstrual period 01/28/2024, SpO2 97%, currently breastfeeding. General appearance: alert, cooperative, appears stated age, and no distress Lungs: clear to auscultation bilaterally Heart: regular rate and rhythm Abdomen: soft, non-tender; bowel sounds normal Pelvic: normal external female genitalia, no blood, clear fluid Extremities: Homans sign is negative, no sign of DVT DTR's normal Presentation: cephalic Fetal monitoring - Baseline: 145 bpm, Variability: Good {> 6 bpm), Accelerations: Reactive, and Decelerations: Absent Uterine activity - Date/time of onset: 0400, Frequency: Every 5 minutes, Duration: 60 seconds, and Intensity: moderate Dilation: 4 Effacement (%): 50 Station: -3 Exam by:: EMERSON Stanley, RNC  Prenatal labs: ABO, Rh: O/Positive/-- (08/06 1129) Antibody: Negative (08/06 1129) Rubella: <0.90 (08/06 1129) RPR: Non Reactive (11/12 1304)  HBsAg: Negative (08/06 1129)  HIV: Non Reactive (11/12 1304)  GBS:   pending (collected on Wednesday in clinic). Negative in last pregnancy.  GTT normal Genetic screening  normal Anatomy US  normal  Declined immunizations this pregnancy.  Prenatal Transfer Tool  Maternal Diabetes: No Genetic Screening: Normal Maternal Ultrasounds/Referrals: Normal Fetal Ultrasounds or other Referrals:  None Maternal Substance Abuse:  No Significant Maternal Medications:  None Significant Maternal Lab Results: None Number of Prenatal Visits:greater than 3 verified prenatal visits Maternal Vaccinations:Declined Other Comments:  None  Results for orders placed or performed during the hospital encounter of  10/14/24 (from the past 24 hours)  Fern Test   Collection Time: 10/14/24 10:18 AM  Result Value Ref Range   POCT Fern Test Positive = ruptured amniotic membanes    Patient Active Problem List   Diagnosis Date Noted   Migraine with aura 07/22/2024   Choroid plexus cyst of fetus affecting care of mother, antepartum 06/14/2024   Supervision of low-risk pregnancy 04/13/2024   Rubella non-immune status, antepartum 06/21/2020   Chronic post-traumatic stress disorder (PTSD) 06/25/2018   MDD (major depressive disorder), recurrent severe, without psychosis (HCC) 06/24/2018   Assessment/Plan:  GRAINNE KNIGHTS is a 19 y.o. G3P1011 at [redacted]w[redacted]d here for SROM 1900 with SOL 0400  #Labor: desires waterbirth, expectant management  #Pain: Waterbirth (declined IV) #FWB: Cat 1 #GBS status:  pending #Feeding: Breastmilk  #Reproductive Life planning: None #Circ:  not applicable  Cornell JONELLE Finder, CNM  10/14/2024, 11:11 AM       [1] No Known Allergies

## 2024-10-14 NOTE — MAU Note (Signed)
 [name] is a 19 y.o. at [redacted]w[redacted]d here in MAU reporting: suspected spontaneous rupture of membranes at 7pm last night (Thursday, 1/15). Waited until contractions started to come to the hospital, which was around 4-5am, but have been irregular.  LMP: 01/28/24 Onset of complaint: 7pm on 10/14/23 Pain score: 5/10 with contractions Vitals:   10/14/24 1004  BP: (!) 105/50  Pulse: (!) 101  Resp: 16  Temp: 98.8 F (37.1 C)     FHT: 145bpm Lab orders placed from triage: FERN/Rule Out Rupture

## 2024-10-14 NOTE — Discharge Instructions (Signed)
 It was a pleasure caring for you. Please follow up with [facility] in 6 weeks.

## 2024-10-14 NOTE — Discharge Summary (Signed)
 Postpartum Discharge Summary    Patient Name: Wendy Short DOB: [birth year] MRN: [id number]  Date of admission: 10/14/2024 Delivery date:10/14/2024 Delivering provider: Annelyse Rey Date of discharge: 10/16/2024  Admitting diagnosis: Indication for care in labor or delivery [O75.9] Intrauterine pregnancy: [redacted]w[redacted]d     Secondary diagnosis:  Active Problems:   Rubella non-immune status, antepartum   Supervision of low-risk pregnancy   Choroid plexus cyst of fetus affecting care of mother, antepartum     Discharge diagnosis: Term Pregnancy Delivered                                              Post partum procedures:None  Augmentation: N/A Complications: None  Hospital course: Onset of Labor With Vaginal Delivery      19 y.o. yo [id number] at [redacted]w[redacted]d was admitted in Latent Labor on 10/14/2024. Labor course was complicated by none  Membrane Rupture Time/Date: 7:00 PM,10/13/2024  Delivery Method:Vaginal, Spontaneous Waterbirth Operative Delivery:N/A Episiotomy: None Lacerations:  Labial Patient had a uncomplicated postpartum course. Hgb:10.1 (10/14/2024). Patient was started on oral iron therapy for clinically significant but asymptomatic acute postoperative anemia due to expected blood loss.   She is ambulating, tolerating a regular diet, passing flatus, and urinating well. Patient is discharged home in stable condition on 10/16/24.  Newborn Data: Birth date:10/14/2024 Birth time:7:21 PM Gender:Female Living status:Living Apgars:8 ,9  Weight:2637 g  Magnesium Sulfate received: No BMZ received: No Rhophylac:N/A MMR:No T-DaP:declined Flu: No RSV Vaccine received: No Transfusion:No  Immunizations received: There is no immunization history for the selected administration types on file for this patient.  Physical exam  Vitals:   10/15/24 1033 10/15/24 1209 10/15/24 2033 10/16/24 0650  BP: (!) 100/54 (!) 108/49 (!) 95/53 (!) 91/54  Pulse: 79 89 82 82  Resp: 18 19 18 18   Temp:  98.2 F (36.8 C) 98.4 F (36.9 C) 98.2 F (36.8 C) 98 F (36.7 C)  TempSrc: Oral Oral Axillary Axillary  SpO2: 98% 99% 99% 99%  Weight:      Height:       General: alert, cooperative, and no distress Lochia: appropriate Uterine Fundus: firm DVT Evaluation: No evidence of DVT seen on physical exam. Labs: Lab Results  Component Value Date   WBC 11.1 (H) 10/14/2024   HGB 10.1 (L) 10/14/2024   HCT 32.2 (L) 10/14/2024   MCV 84.3 10/14/2024   PLT 280 10/14/2024      Latest Ref Rng & Units 01/22/2020    1:09 PM  CMP  Glucose 70 - 99 mg/dL 854   BUN 4 - 18 mg/dL 4   Creatinine 9.49 - 8.99 mg/dL 9.39   Sodium 864 - 854 mmol/L 139   Potassium 3.5 - 5.1 mmol/L 3.1   Chloride 98 - 111 mmol/L 99    Edinburgh Score:    10/15/2024    5:41 PM  Edinburgh Postnatal Depression Scale Screening Tool  I have been able to laugh and see the funny side of things. 0  I have looked forward with enjoyment to things. 0  I have blamed myself unnecessarily when things went wrong. 1  I have been anxious or worried for no good reason. 1  I have felt scared or panicky for no good reason. 0  Things have been getting on top of me. 0  I have been so unhappy that I have had difficulty sleeping. 0  I have felt sad or miserable. 0  I have been so unhappy that I have been crying. 0  The thought of harming myself has occurred to me. 0  Edinburgh Postnatal Depression Scale Total 2   Edinburgh Postnatal Depression Scale Total: 2   After visit meds:  Allergies as of 10/16/2024   No Known Allergies      Medication List     STOP taking these medications    ondansetron  4 MG disintegrating tablet Commonly known as: ZOFRAN -ODT       TAKE these medications    acetaminophen  325 MG tablet Commonly known as: Tylenol  Take 2 tablets (650 mg total) by mouth every 4 (four) hours as needed (for pain scale < 4).   benzocaine -Menthol  20-0.5 % Aero Commonly known as: DERMOPLAST Apply 1 Application  topically as needed for irritation (perineal discomfort).   coconut oil Oil Apply 1 Application topically as needed.   ferrous sulfate  325 (65 FE) MG tablet Take 1 tablet (325 mg total) by mouth every other day. Start taking on: October 17, 2024   ibuprofen  600 MG tablet Commonly known as: ADVIL  Take 1 tablet (600 mg total) by mouth every 6 (six) hours as needed.   polyethylene glycol 17 g packet Commonly known as: MiraLax  Take 17 g by mouth daily.   PRENATAL GUMMIES PO Take 2 tablets by mouth daily at 6 (six) AM.   witch hazel-glycerin  pad Commonly known as: TUCKS Apply 1 Application topically as needed for hemorrhoids.       Discharge home in stable condition Infant Feeding: Breast Infant Disposition:home with mother Discharge instruction: per After Visit Summary and Postpartum booklet. Activity: Advance as tolerated. Pelvic rest for 6 weeks.  Diet: routine diet  Message sent 10/14/24  Please schedule this patient for a In person postpartum visit in 6 weeks with the following provider: CNM. Additional Postpartum F/U:None  Low risk pregnancy complicated by: none Delivery mode:  Vaginal, Spontaneous Anticipated Birth Control:  None    10/16/2024 Beatris Belen, CNM

## 2024-10-14 NOTE — Progress Notes (Signed)
 Patient ID: Wendy Short, female   DOB: [birth year], 19 y.o.   MRN: [id number]  BP 109/75   Pulse (!) 109   Temp 98.2 F (36.8 C) (Oral)   Resp 16   Ht 5' 5 (1.651 m)   Wt 66.2 kg   LMP 01/28/2024 (Approximate)   SpO2 100%   BMI 24.30 kg/m   Dilation: 6.5 Effacement (%): 80 Cervical Position: Posterior Station: Plus 1 Presentation: Vertex Exam by:: Tommy Daring, RN  Assessment/Plan: Labor progressing well. SVE: 6.5/80/+1. Patient reports increased pressure. IA: 130 bpm with contractions q1-1.5 mins. No decelerations and accelerations present. Continue expectant management. Received penicillin  x 1 due to GBS unknown.   Signed: Juniper Cobey, MSN,CNM 10/14/2024, 6:40 PM

## 2024-10-14 NOTE — Progress Notes (Signed)
 Wendy Short is a 19 y.o. G3P1011 at [redacted]w[redacted]d by LMP admitted for PROM.   Subjective: Patient is on birthing ball and breathing through contractions.   Objective: BP (!) 110/56   Pulse 85   Temp 98.1 F (36.7 C) (Oral)   Resp 16   Ht 5' 5 (1.651 m)   Wt 66.2 kg   LMP 01/28/2024 (Approximate)   SpO2 100%   BMI 24.30 kg/m  No intake/output data recorded. No intake/output data recorded.   IA by RN: 145 bpm Accelerations noted by RN: Present Decelerations noted by RN: Not Present  Uterine Activity via palpation by RN: q3 mins Duration: 60 secs   SVE:   Dilation: 4.5 Effacement (%): 70 Station: -1, 0 Exam by:: Tommy Daring, RN  Labs: Lab Results  Component Value Date   WBC 11.1 (H) 10/14/2024   HGB 10.1 (L) 10/14/2024   HCT 32.2 (L) 10/14/2024   MCV 84.3 10/14/2024   PLT 280 10/14/2024   Assessment / Plan: Spontaneous labor, progressing normally Labor: Desires waterbirth, expectant management  Fetal Wellbeing:  Cat 1 GBS Status: Pending  Pain Control:  Water tub Anticipated MOD:  NSVD  [name], CNM 10/14/2024, 2:29 PM

## 2024-10-15 LAB — SYPHILIS: RPR W/REFLEX TO RPR TITER AND TREPONEMAL ANTIBODIES, TRADITIONAL SCREENING AND DIAGNOSIS ALGORITHM: RPR Ser Ql: NONREACTIVE

## 2024-10-15 NOTE — Lactation Note (Signed)
 This note was copied from a baby's chart. Lactation Consultation Note  Patient Name: Wendy Short Unijb'd Date: 10/15/2024 Age:21 hours Reason for consult: Follow-up assessment;Early term 37-38.6wks;Infant < 6lbs;Infant weight loss;Breastfeeding assistance Mother requested to see the LC .  LC reviewed the doc flow sheets with parents and updated.  LC reassured parents for the age of the baby of 13 hours she is WNL.  Baby has breast fed 4 - 10 mins feedings and 1- 5 min feeding. Has stooled x 4 and HNV. LC reassured parents that is normal and she has 24 hours to wet. Only one Latch score of 6  LC offered to assist and mom receptive.  LC discussed the importance of obtaining depth at the breast  consistently with every feeding.  LC reviewed cross cradle and added the support pillows. Mom did well to latch and obtain the depth and LC showed mom how to flip the upper lip to increase the flange and ease down the chin. Increased swallows noted.Wendy Short score 9  LC stepped out of the room to obtain the DEBP, and  mom had latched the baby on the right breast with depth and more swallows noted.  Baby still feeding.   LC reviewed 24 hour breast feeding goals - feed with cues and by 3 hours STS, after baby is settled post pump 15 mins and safe the milk for the next feeding.  Mom is aware why its recommended for extra pumping - ET , Less than 6 pounds.   Maternal Data Mother's Current Feeding Choice: Breast Milk 2 years with her 1st baby without issues.   LATCH Score Latch: Grasps breast easily, tongue down, lips flanged, rhythmical sucking.  Audible Swallowing: Spontaneous and intermittent  Type of Nipple: Everted at rest and after stimulation  Comfort (Breast/Nipple): Soft / non-tender  Hold (Positioning): Assistance needed to correctly position infant at breast and maintain latch.  LATCH Score: 9   Lactation Tools Discussed/Used  Hand pump and DEBP set up , #18 F ,  cleaning Storage of breast milk   Interventions  Education   Discharge Pump: Manual;Referral sent for [facility] Pump [facility] Program: No  Consult Status Consult Status: Follow-up Date: 11/13/24 Follow-up type: In-patient    Rollene Caldron Bram Hottel 10/15/2024, 8:56 AM

## 2024-10-15 NOTE — Progress Notes (Signed)
 POSTPARTUM PROGRESS NOTE  Post Partum Day 1  Subjective:  Wendy Short is a 19 y.o. H6E7987 s/p SVD at [redacted]w[redacted]d.  She reports she is doing well. No acute events overnight. She denies any problems with ambulating, voiding or po intake. Denies nausea or vomiting.  Pain is well controlled.  Lochia is Normal.  Objective: Blood pressure (!) 100/54, pulse 79, temperature 98.2 F (36.8 C), temperature source Oral, resp. rate 18, height 5' 5 (1.651 m), weight 66.2 kg, last menstrual period 01/28/2024, SpO2 98%, unknown if currently breastfeeding.  BP Readings from Last 3 Encounters:  10/15/24 (!) 100/54  10/12/24 120/69  09/28/24 107/67    Physical Exam:  General: alert, cooperative and no distress Chest: no respiratory distress Heart:regular rate, distal pulses intact Uterine Fundus: firm, appropriately tender DVT Evaluation: No calf swelling or tenderness Extremities: no edema Skin: warm, dry  Recent Labs    10/12/24 1704 10/14/24 1150  HGB 8.1* 10.1*  HCT  --  32.2*    Assessment/Plan: Wendy Short is a 19 y.o. H6E7987 s/p NSVD at [redacted]w[redacted]d   PPD# 1 - Doing well  Routine postpartum care  Delivery Complications: none Blood Pressure: normal Hb Status: Stable, appropriate postpartum Hb, no intervention indicated Contraception: undecided Feeding: breast feeding Needs Lactation Consultation: Yes   Dispo: Plan for discharge tomorrow.   LOS: 1 day   Charlie DELENA Courts, MD OB Fellow  10/15/2024, 11:20 AM

## 2024-10-15 NOTE — Plan of Care (Signed)
   Problem: Education: Goal: Knowledge of General Education information will improve Description: Including pain rating scale, medication(s)/side effects and non-pharmacologic comfort measures Outcome: Completed/Met

## 2024-10-16 ENCOUNTER — Other Ambulatory Visit (HOSPITAL_COMMUNITY): Payer: Self-pay

## 2024-10-16 MED ORDER — BENZOCAINE-MENTHOL 20-0.5 % EX AERO: 1.0000 | INHALATION_SPRAY | CUTANEOUS | Status: AC | PRN

## 2024-10-16 MED ORDER — IBUPROFEN 600 MG PO TABS
600.0000 mg | ORAL_TABLET | Freq: Four times a day (QID) | ORAL | 0 refills | Status: AC | PRN
  Filled 2024-10-16: qty 30, 8d supply, fill #0

## 2024-10-16 MED ORDER — FERROUS SULFATE 325 (65 FE) MG PO TABS
325.0000 mg | ORAL_TABLET | ORAL | 0 refills | Status: AC
  Filled 2024-10-16: qty 15, 30d supply, fill #0

## 2024-10-16 MED ORDER — ACETAMINOPHEN 325 MG PO TABS
650.0000 mg | ORAL_TABLET | ORAL | 0 refills | Status: AC | PRN
  Filled 2024-10-16: qty 30, 3d supply, fill #0

## 2024-10-16 MED ORDER — WITCH HAZEL-GLYCERIN EX PADS: 1.0000 | MEDICATED_PAD | CUTANEOUS | Status: AC | PRN

## 2024-10-16 MED ORDER — COCONUT OIL OIL: 1.0000 | TOPICAL_OIL | Status: AC | PRN

## 2024-10-16 NOTE — Progress Notes (Signed)
 " CLINICAL SOCIAL WORK MATERNAL/CHILD NOTE  Patient Details  Name: Wendy Short MRN: [id number] Date of Birth: June 17, 2027  Date:  11/24/24  Clinical Social Worker Initiating Note:  Sharyne Roulette, LCSWA Date/Time: Initiated:  10/16/24/1152     Child's Name:  Wendy Short   Biological Parents:  Mother, Father (FOB: Wendy Short, DOB: 10/07/2000)   Need for Interpreter:  None   Reason for Referral:  Behavioral Health Concerns   Address:  [address] [place], [place] [address]   Phone number:  [phone] (home)     Additional phone number:   Household Members/Support Persons (HM/SP):   Household Member/Support Person 1, Household Member/Support Person 2   HM/SP Name Relationship DOB or Age  HM/SP -1 Wendy Short FOB/Signficant Other 10/07/2000  HM/SP -2 Wendy Short Daughter 12/28/2020  HM/SP -3        HM/SP -4        HM/SP -5        HM/SP -6        HM/SP -7        HM/SP -8          Natural Supports (not living in the home):  Immediate Family, Friends   Professional Supports: Therapist (Sarah Chief Of Staff, [facility])   Employment: Futures Trader   Type of Work:     Education:  Engineer, agricultural   Homebound arranged:    Surveyor, Quantity Resources:  Medicaid   Other Resources:   (WIC referral placed)   Cultural/Religious Considerations Which May Impact Care:    Strengths:  Ability to meet basic needs  , Home prepared for child  , Pediatrician chosen   Psychotropic Medications:         Pediatrician:    [place] (including Copywriter, Advertising and surronding areas)  Pediatrician List:   Radiographer, Therapeutic    [place] [facility] Other (Dr. Avelina LABOR. Fraser, MD - [place], [place])  [facility]      Pediatrician Fax Number:    Risk Factors/Current Problems:  Mental Health Concerns     Cognitive State:  Goal Oriented  , Able to Concentrate  , Insightful  , Alert  , Linear  Thinking     Mood/Affect:  Calm  , Euthymic  , Happy  , Interested     CSW Assessment: CSW was consulted due to history of depression, PTSD, and Borderline Personality Disorder. CSW met with MOB at bedside to complete assessment. When CSW entered room, MOB was observed sitting in hospital bed holding infant. FOB was present sitting nearby CSW introduced self and requested to speak with MOB alone. FOB left the room. CSW explained reason for consult. MOB presented as calm, was agreeable to consult and remained engaged throughout encounter.   CSW reviewed demographic information in chart. MOB reports her address in chart is not current. MOB reports she recently moved and now resides at [address], [address] [place], [place] [address]. MOB resides with FOB and her other daughter, Wendy. MOB reports having good support, marked by her mom, dad, friend Wendy Short, and younger brother Wendy Short.  CSW assessed current mood and inquired about MOB's mental health history. MOB reports feeling good, just tired since infant's arrival. MOB reports having a positive delivery experience. MOB acknowledged a history of depression, PTSD, and Borderline Personality Disorder, stating she was diagnosed with all diagnoses at age 67. CSW inquired about MOB's mental health during  pregnancy. MOB reports feeling pretty great during her pregnancy. MOB states she has not had any bad moments since having her daughter Wendy, who is now 4 years old. MOB reports she also began therapy with Lauraine Person, [facility], through [facility] about two months ago. MOB states she meets with her therapist every other week and finds it beneficial. CSW inquired if MOB experienced symptoms of postpartum depression following the birth of her first daughter, Wendy. MOB reports symptoms of postpartum, sharing she felt her symptoms were due to her situation at the time, which MOB did not expand on. MOB reports she began seeing a therapist as treatment  and her symptoms resolved after a couple months. MOB states she is not prescribed psychotropic medication and feels well supported through therapy. CSW inquired about coping skills. MOB identified taking time for herself, marked by taking a shower or asking a support to care for her daughter as helpful. MOB states she is open with her supports about mental health. CSW positvely affirmed MOB's healthy coping skills. CSW assessed for safety. MOB denied current SI/HI/domestic violence.  CSW provided education regarding the baby blues period vs. perinatal mood disorders, discussed treatment and gave resources for mental health follow up if concerns arise.  CSW recommends self-evaluation during the postpartum time period using the New Mom Checklist from Postpartum Progress and encouraged MOB to contact a medical professional if symptoms are noted at any time.    MOB reports she has all needed items for infant, including a car seat and bassinet.  CSW provided review of Sudden Infant Death Syndrome (SIDS) precautions. MOB expressed interest in [facility], stating she has received WIC benefits in the past. CSW placed [facility] referral with consent and also provided MOB with contact information for follow up. MOB declined additional resource needs at this time.  CSW identifies no further need for intervention and no barriers to discharge at this time.  CSW Plan/Description:  No Further Intervention Required/No Barriers to Discharge, Sudden Infant Death Syndrome (SIDS) Education, Perinatal Mood and Anxiety Disorder (PMADs) Education, Other Information/Referral to [employer] [place], LCSWA Feb 15, 2025, 11:57 AM  "

## 2024-10-16 NOTE — Lactation Note (Signed)
 This note was copied from a baby's chart. Lactation Consultation Note  Patient Name: Wendy Short Unijb'd Date: 10/16/2024 Age:21 hours Reason for consult: Follow-up assessment;Early term 37-38.6wks;Infant < 6lbs;Infant weight loss Per mom latching continues to go well and I've pumped a few times and getting milk.  LC reviewed breast feeding D/C teaching and the [facility] resources.  LC reminded  mom with a baby less than 6 pounds when the milk comes in the baby often only feeds on 1 breast until the baby grows.  So there may be several feedings where mom is pumping down the 2nd breast.  LC stressed the importance of engorgement prevention and tx.  LC reviewed supply and demand.    Maternal Data Does the patient have breastfeeding experience prior to this delivery?: Yes  Feeding Mother's Current Feeding Choice: Breast Milk  LATCH Score 6-9-7     Lactation Tools Discussed/Used Tools: Pump;Flanges Flange Size: 18 Breast pump type: Manual;Double-Electric Breast Pump Pump Education: Setup, frequency, and cleaning;Milk Storage Pumped volume:  (per mom pumping off milk)  Interventions Interventions: Breast feeding basics reviewed;Hand pump;DEBP;Education;LC Services brochure;CDC milk storage guidelines;CDC Guidelines for Breast Pump Cleaning  Discharge Discharge Education: Warning signs for feeding baby;Engorgement and breast care;Outpatient recommendation (if needed) Pump: Manual;Received [place] Pump;DEBP WIC Program: No  Consult Status Consult Status: Complete Date: 10/16/24    Wendy Short 10/16/2024, 10:05 AM

## 2024-10-16 NOTE — Progress Notes (Signed)
 Per the chart, pt still has a legal guardian and RN is unable to print AVS until the guardian has been notified of her pending discharge. Confirmed with the pt that she no longer has a legal guardian, but RN was still unable to change her guardianship status in the chart. In order to print her AVS, RN documented that the guardian was notified, printed the AVS, and then deleted the documentation.

## 2024-10-17 LAB — CULTURE, BETA STREP (GROUP B ONLY): Strep Gp B Culture: NEGATIVE

## 2024-10-19 ENCOUNTER — Encounter: Payer: Self-pay | Admitting: Certified Nurse Midwife

## 2024-10-19 NOTE — Progress Notes (Signed)
 "  PRENATAL VISIT NOTE  Subjective:  Wendy Short is a 19 y.o. H6E7987 at [redacted]w[redacted]d being seen today for ongoing prenatal care.  She is currently monitored for the following issues for this low-risk pregnancy and has MDD (major depressive disorder), recurrent severe, without psychosis (HCC); Chronic post-traumatic stress disorder (PTSD); Rubella non-immune status, antepartum; Supervision of low-risk pregnancy; Choroid plexus cyst of fetus affecting care of mother, antepartum; and Migraine with aura on their problem list.  Patient reports occasional contractions and vaginal irritation.  Contractions: Not present. Vag. Bleeding: None.  Movement: Present. Denies leaking of fluid.   The following portions of the patient's history were reviewed and updated as appropriate: allergies, current medications, past family history, past medical history, past social history, past surgical history and problem list.   Objective:   Vitals:   10/12/24 1631  BP: 120/69  Pulse: (!) 111  Weight: 142 lb (64.4 kg)    Fetal Status:  Fetal Heart Rate (bpm): 145   Movement: Present    General: Alert, oriented and cooperative. Patient is in no acute distress.  Skin: Skin is warm and dry. No rash noted.   Cardiovascular: Normal heart rate noted  Respiratory: Normal respiratory effort, no problems with respiration noted  Abdomen: Soft, gravid, appropriate for gestational age.  Pain/Pressure: Absent     Pelvic: Cervical exam deferred        Extremities: Normal range of motion.  Edema: None  Mental Status: Normal mood and affect. Normal behavior. Normal judgment and thought content.      05/04/2024    5:24 PM 07/03/2021    4:30 PM 06/11/2021    5:30 PM  Depression screen PHQ 2/9  Decreased Interest 0 0 0  Down, Depressed, Hopeless 0 0 0  PHQ - 2 Score 0 0 0  Altered sleeping 0 1 0  Tired, decreased energy 1 1 1   Change in appetite 1 1 1   Feeling bad or failure about yourself  0 0 0  Trouble concentrating 0 0  0  Moving slowly or fidgety/restless 0 0 0  Suicidal thoughts 0 0 0  PHQ-9 Score 2  3  2    Difficult doing work/chores  Not difficult at all      Data saved with a previous flowsheet row definition        05/04/2024    5:24 PM 07/03/2021    4:30 PM 06/11/2021    5:30 PM 12/12/2020    1:28 PM  GAD 7 : Generalized Anxiety Score  Nervous, Anxious, on Edge 1  0  1  0   Control/stop worrying 0  0  1  0   Worry too much - different things 0  0  1  0   Trouble relaxing 0  1  0  0   Restless 0  0  0  0   Easily annoyed or irritable 1  1  1   0   Afraid - awful might happen 0  0  1  0   Total GAD 7 Score 2 2 5  0     Data saved with a previous flowsheet row definition    Assessment and Plan:  Pregnancy: G3P2012 at [redacted]w[redacted]d 1. Encounter for supervision of low-risk pregnancy in third trimester (Primary) - Doing well, feeling regular and vigorous fetal movement   2. [redacted] weeks gestation of pregnancy - Routine OB care  - Culture, beta strep (group b only)  3. Vaginal discharge during pregnancy in third trimester -  Cervicovaginal ancillary only( [place])   Preterm labor symptoms and general obstetric precautions including but not limited to vaginal bleeding, contractions, leaking of fluid and fetal movement were reviewed in detail with the patient. Please refer to After Visit Summary for other counseling recommendations.   Return in about 1 week (around 10/19/2024) for IN-PERSON, LOB.  Future Appointments  Date Time Provider Department Center  11/30/2024  1:15 PM Vannie Cornell SAUNDERS, CNM [facility]    Cornell SAUNDERS Vannie, CNM "

## 2024-10-25 ENCOUNTER — Telehealth (HOSPITAL_COMMUNITY): Payer: Self-pay

## 2024-10-25 NOTE — Telephone Encounter (Signed)
 10/25/2024 1409  Name: Wendy Short MRN: [id number] DOB: [birth year]  Reason for Call:  Transition of Care Hospital Discharge Call  Contact Status: Patient Contact Status: Complete  Language assistant needed:          Follow-Up Questions: Do You Have Any Concerns About Your Health As You Heal From Delivery?: No Do You Have Any Concerns About Your Infants Health?: No  Edinburgh Postnatal Depression Scale:  In the Past 7 Days: I have been able to laugh and see the funny side of things.: As much as I always could I have looked forward with enjoyment to things.: As much as I ever did I have blamed myself unnecessarily when things went wrong.: Yes, some of the time I have been anxious or worried for no good reason.: No, not at all I have felt scared or panicky for no good reason.: No, not much Things have been getting on top of me.: No, I have been coping as well as ever I have been so unhappy that I have had difficulty sleeping.: Not at all I have felt sad or miserable.: No, not at all I have been so unhappy that I have been crying.: No, never The thought of harming myself has occurred to me.: Never Edinburgh Postnatal Depression Scale Total: 3  PHQ2-9 Depression Scale:     Discharge Follow-up: Edinburgh score requires follow up?: No Patient was advised of the following resources:: Breastfeeding Support Group, Support Group  Post-discharge interventions: Reviewed Newborn Safe Sleep Practices  Signature  Rosaline Deretha PEAK

## 2024-10-26 ENCOUNTER — Encounter: Payer: Self-pay | Admitting: Certified Nurse Midwife

## 2024-11-01 ENCOUNTER — Encounter: Payer: Self-pay | Admitting: Certified Nurse Midwife

## 2024-11-02 ENCOUNTER — Encounter: Payer: Self-pay | Admitting: Certified Nurse Midwife

## 2024-11-04 ENCOUNTER — Ambulatory Visit: Payer: Self-pay | Admitting: Family Medicine

## 2024-11-04 ENCOUNTER — Ambulatory Visit: Admitting: Family Medicine

## 2024-11-04 ENCOUNTER — Encounter: Payer: Self-pay | Admitting: Family Medicine

## 2024-11-04 NOTE — Progress Notes (Signed)
 "  GYNECOLOGY OFFICE VISIT NOTE  History:   Wendy Short is a 19 y.o. H6E7987 here today for incision check.  Three days prior patient sent message reporting that she had torn some stitches Per review of delivery note she had L labial laceration repaired with 4-0 monocryl and vicryl  Today patient reports she was bleeding from her stitches a few days ago, it filled up a panty liner Has been very painful since then   Health Maintenance Due  Topic Date Due   HPV VACCINES (1 - 3-dose series) Never done   Meningococcal B Vaccine (1 of 2 - Standard) Never done   DTaP/Tdap/Td (1 - Tdap) Never done   Hepatitis B Vaccines 19-59 Average Risk (1 of 3 - 19+ 3-dose series) Never done   Influenza Vaccine  Never done   COVID-19 Vaccine (1 - 2025-26 season) Never done    Past Medical History:  Diagnosis Date   Borderline personality disorder (HCC)    Depression    Poor fetal growth affecting management of mother in third trimester 11/19/2020   Pregnancy affected by fetal growth restriction 12/24/2020   PTSD (post-traumatic stress disorder)    Supervision of high risk pregnancy, antepartum 05/31/2020    Nursing Staff Provider Office Location [facility] Dating  Early US  at [redacted]w[redacted]d Language  English Anatomy US    Flu Vaccine  Declined Genetic Screen  NIPS: low risk female AFP:  Screen Negative  TDaP Vaccine  Declined 11/19/20  Hgb A1C or  GTT Early  A1C 5.3 Third trimester  COVID Vaccine no   LAB RESULTS  Rhogam  NA O+ Blood Type O/Positive/-- (09/21 1608)  Feeding Plan Breast Antibody Negative (09/21 1608) C   Supervision of low-risk first pregnancy 12/28/2020   Unplanned pregnancy 06/19/2020   Weight loss     Past Surgical History:  Procedure Laterality Date   NO PAST SURGERIES      The following portions of the patient's history were reviewed and updated as appropriate: allergies, current medications, past family history, past medical history, past social history, past surgical history and problem  list.   Health Maintenance:   Last pap: No Cervical Cancer Screening results to display. N/a  Last mammogram:  N/a    Review of Systems:  Pertinent items noted in HPI and remainder of comprehensive ROS otherwise negative.  Physical Exam:  BP 132/79   Pulse 93   Wt 129 lb 3.2 oz (58.6 kg)   BMI 21.50 kg/m   Physical Exam Constitutional:      General: She is not in acute distress.    Appearance: Normal appearance. She is not ill-appearing.  HENT:     Head: Atraumatic.  Eyes:     General: No scleral icterus.    Conjunctiva/sclera: Conjunctivae normal.  Pulmonary:     Effort: Pulmonary effort is normal.  Genitourinary:    Comments: Some suture material and knots present on R labia, no subcutaneous tissue visible, all healthy and normal appearing Skin:    General: Skin is warm and dry.     Coloration: Skin is not jaundiced or pale.  Neurological:     Mental Status: She is alert.     Coordination: Coordination normal.  Psychiatric:        Mood and Affect: Mood normal.        Behavior: Behavior normal.     Labs and Imaging No results found for this or any previous visit (from the past week). No results found.  Assessment and Plan:   Problem List Items Addressed This Visit   None Visit Diagnoses       Perineal laceration complicating delivery    -  Primary      Healthy tissue seen on exam, no areas of break down. Reassured patient that no intervention required at present, encouraged to use peri bottle, dermoplast, and gently wipe when using the bathroom so that when underlying suture has dissolved the overlying knot will come away and stop irritating the vulva.     Return in about 2 weeks (around 11/18/2024) for PP check.    Total face-to-face time with patient: 10 minutes.  Over 50% of encounter was spent on counseling and coordination of care.   Donnice CHRISTELLA Carolus, MD/MPH Attending Family Medicine Physician, [facility] for [facility], [facility] "

## 2024-11-09 ENCOUNTER — Encounter: Payer: Self-pay | Admitting: Certified Nurse Midwife

## 2024-11-30 ENCOUNTER — Ambulatory Visit: Payer: Self-pay | Admitting: Certified Nurse Midwife
